# Patient Record
Sex: Female | Born: 1945 | Race: Black or African American | Hispanic: No | Marital: Single | Smoking: Current every day smoker
Health system: Southern US, Community
[De-identification: ages and names within clinical notes are randomized; demographics above are authoritative.]

## PROBLEM LIST (undated history)

## (undated) DIAGNOSIS — E119 Type 2 diabetes mellitus without complications: Secondary | ICD-10-CM

## (undated) DIAGNOSIS — Z9889 Other specified postprocedural states: Secondary | ICD-10-CM

## (undated) DIAGNOSIS — K08109 Complete loss of teeth, unspecified cause, unspecified class: Secondary | ICD-10-CM

## (undated) DIAGNOSIS — N189 Chronic kidney disease, unspecified: Secondary | ICD-10-CM

## (undated) DIAGNOSIS — Z972 Presence of dental prosthetic device (complete) (partial): Secondary | ICD-10-CM

## (undated) DIAGNOSIS — C801 Malignant (primary) neoplasm, unspecified: Secondary | ICD-10-CM

## (undated) DIAGNOSIS — G629 Polyneuropathy, unspecified: Secondary | ICD-10-CM

## (undated) DIAGNOSIS — Z973 Presence of spectacles and contact lenses: Secondary | ICD-10-CM

## (undated) DIAGNOSIS — I10 Essential (primary) hypertension: Secondary | ICD-10-CM

## (undated) DIAGNOSIS — E785 Hyperlipidemia, unspecified: Secondary | ICD-10-CM

## (undated) DIAGNOSIS — Z8679 Personal history of other diseases of the circulatory system: Secondary | ICD-10-CM

## (undated) DIAGNOSIS — I639 Cerebral infarction, unspecified: Secondary | ICD-10-CM

## (undated) DIAGNOSIS — J45909 Unspecified asthma, uncomplicated: Secondary | ICD-10-CM

## (undated) HISTORY — DX: Unspecified asthma, uncomplicated: J45.909

## (undated) HISTORY — PX: MASTECTOMY: SHX3

## (undated) HISTORY — DX: Hyperlipidemia, unspecified: E78.5

## (undated) HISTORY — DX: Essential (primary) hypertension: I10

## (undated) HISTORY — DX: Type 2 diabetes mellitus without complications: E11.9

## (undated) HISTORY — DX: Cerebral infarction, unspecified: I63.9

---

## 1966-10-08 HISTORY — PX: GANGLION CYST EXCISION: SHX1691

## 1997-10-08 HISTORY — PX: CEREBRAL ANEURYSM REPAIR: SHX164

## 1998-05-04 ENCOUNTER — Emergency Department (HOSPITAL_COMMUNITY): Admission: EM | Admit: 1998-05-04 | Discharge: 1998-05-04 | Payer: Self-pay | Admitting: Emergency Medicine

## 1999-04-04 ENCOUNTER — Emergency Department (HOSPITAL_COMMUNITY): Admission: EM | Admit: 1999-04-04 | Discharge: 1999-04-04 | Payer: Self-pay | Admitting: Emergency Medicine

## 1999-04-10 ENCOUNTER — Ambulatory Visit (HOSPITAL_COMMUNITY): Admission: RE | Admit: 1999-04-10 | Discharge: 1999-04-10 | Payer: Self-pay | Admitting: Family Medicine

## 2000-02-06 ENCOUNTER — Ambulatory Visit (HOSPITAL_COMMUNITY): Admission: RE | Admit: 2000-02-06 | Discharge: 2000-02-06 | Payer: Self-pay | Admitting: *Deleted

## 2000-04-25 ENCOUNTER — Ambulatory Visit (HOSPITAL_COMMUNITY): Admission: RE | Admit: 2000-04-25 | Discharge: 2000-04-25 | Payer: Self-pay | Admitting: *Deleted

## 2001-01-30 ENCOUNTER — Emergency Department (HOSPITAL_COMMUNITY): Admission: EM | Admit: 2001-01-30 | Discharge: 2001-01-30 | Payer: Self-pay | Admitting: Emergency Medicine

## 2001-05-12 ENCOUNTER — Emergency Department (HOSPITAL_COMMUNITY): Admission: EM | Admit: 2001-05-12 | Discharge: 2001-05-12 | Payer: Self-pay | Admitting: Emergency Medicine

## 2001-05-12 ENCOUNTER — Encounter: Payer: Self-pay | Admitting: Emergency Medicine

## 2001-06-21 ENCOUNTER — Emergency Department (HOSPITAL_COMMUNITY): Admission: EM | Admit: 2001-06-21 | Discharge: 2001-06-21 | Payer: Self-pay

## 2002-09-21 ENCOUNTER — Encounter: Payer: Self-pay | Admitting: Neurology

## 2002-09-21 ENCOUNTER — Inpatient Hospital Stay (HOSPITAL_COMMUNITY): Admission: EM | Admit: 2002-09-21 | Discharge: 2002-09-23 | Payer: Self-pay | Admitting: Emergency Medicine

## 2002-09-22 ENCOUNTER — Encounter (INDEPENDENT_AMBULATORY_CARE_PROVIDER_SITE_OTHER): Payer: Self-pay | Admitting: Cardiology

## 2002-09-23 ENCOUNTER — Inpatient Hospital Stay (HOSPITAL_COMMUNITY)
Admission: RE | Admit: 2002-09-23 | Discharge: 2002-10-05 | Payer: Self-pay | Admitting: Physical Medicine & Rehabilitation

## 2002-12-11 ENCOUNTER — Ambulatory Visit (HOSPITAL_COMMUNITY): Admission: RE | Admit: 2002-12-11 | Discharge: 2002-12-11 | Payer: Self-pay | Admitting: Neurology

## 2002-12-11 ENCOUNTER — Encounter: Payer: Self-pay | Admitting: Neurology

## 2003-01-05 ENCOUNTER — Inpatient Hospital Stay (HOSPITAL_COMMUNITY): Admission: RE | Admit: 2003-01-05 | Discharge: 2003-01-06 | Payer: Self-pay | Admitting: Interventional Radiology

## 2003-01-05 DIAGNOSIS — I671 Cerebral aneurysm, nonruptured: Secondary | ICD-10-CM | POA: Insufficient documentation

## 2003-02-04 ENCOUNTER — Encounter
Admission: RE | Admit: 2003-02-04 | Discharge: 2003-05-05 | Payer: Self-pay | Admitting: Physical Medicine & Rehabilitation

## 2003-04-29 ENCOUNTER — Ambulatory Visit (HOSPITAL_COMMUNITY): Admission: RE | Admit: 2003-04-29 | Discharge: 2003-04-29 | Payer: Self-pay | Admitting: Internal Medicine

## 2003-04-29 ENCOUNTER — Encounter: Payer: Self-pay | Admitting: Internal Medicine

## 2003-05-12 ENCOUNTER — Ambulatory Visit (HOSPITAL_COMMUNITY): Admission: RE | Admit: 2003-05-12 | Discharge: 2003-05-12 | Payer: Self-pay | Admitting: Interventional Radiology

## 2003-07-07 ENCOUNTER — Ambulatory Visit (HOSPITAL_COMMUNITY): Admission: RE | Admit: 2003-07-07 | Discharge: 2003-07-08 | Payer: Self-pay | Admitting: Interventional Radiology

## 2003-10-06 ENCOUNTER — Encounter
Admission: RE | Admit: 2003-10-06 | Discharge: 2004-01-04 | Payer: Self-pay | Admitting: Physical Medicine & Rehabilitation

## 2003-10-11 ENCOUNTER — Ambulatory Visit (HOSPITAL_COMMUNITY): Admission: RE | Admit: 2003-10-11 | Discharge: 2003-10-11 | Payer: Self-pay | Admitting: Interventional Radiology

## 2003-11-18 ENCOUNTER — Ambulatory Visit (HOSPITAL_COMMUNITY)
Admission: RE | Admit: 2003-11-18 | Discharge: 2003-11-18 | Payer: Self-pay | Admitting: Physical Medicine & Rehabilitation

## 2004-04-14 ENCOUNTER — Ambulatory Visit (HOSPITAL_COMMUNITY): Admission: RE | Admit: 2004-04-14 | Discharge: 2004-04-14 | Payer: Self-pay | Admitting: Interventional Radiology

## 2004-04-28 ENCOUNTER — Encounter: Admission: RE | Admit: 2004-04-28 | Discharge: 2004-04-28 | Payer: Self-pay | Admitting: Family Medicine

## 2004-05-31 ENCOUNTER — Encounter
Admission: RE | Admit: 2004-05-31 | Discharge: 2004-07-13 | Payer: Self-pay | Admitting: Physical Medicine & Rehabilitation

## 2004-06-16 ENCOUNTER — Ambulatory Visit (HOSPITAL_COMMUNITY)
Admission: RE | Admit: 2004-06-16 | Discharge: 2004-06-16 | Payer: Self-pay | Admitting: Physical Medicine & Rehabilitation

## 2004-07-13 ENCOUNTER — Encounter
Admission: RE | Admit: 2004-07-13 | Discharge: 2004-10-11 | Payer: Self-pay | Admitting: Physical Medicine & Rehabilitation

## 2004-07-14 ENCOUNTER — Ambulatory Visit: Payer: Self-pay | Admitting: Physical Medicine & Rehabilitation

## 2004-07-17 ENCOUNTER — Ambulatory Visit: Payer: Self-pay | Admitting: Family Medicine

## 2004-08-29 ENCOUNTER — Ambulatory Visit: Payer: Self-pay | Admitting: Family Medicine

## 2004-09-28 IMAGING — CT CT HEAD W/O CM
1 series · 16 of 30 positions shown, 20 images · non-contrast
Comparison: none

FINDINGS
CLINICAL DATA: APHASIA AND BILATERAL LOWER EXTREMITY WEAKNESS.
CT OF THE HEAD WITHOUT CONTRAST
NO PRIOR STUDIES FOR COMPARISON.
MILD ATROPHY PRESENT.  NO EVIDENCE OF VISIBLE HEMORRHAGE, INFARCT, MASS EFFECT OR EDEMA.  NO
HYDROCEPHALUS OR EXTRA-AXIAL FLUID COLLECTIONS.  BONE WINDOWS SHOW MUCOSAL THICKENING IN THE RIGHT
MAXILLARY ANTRUM.
IMPRESSION
NO DEFINITE ACUTE FINDINGS.

[Series 2: — · axial · 0.49mm/px · z∈[+145,+280]mm · 16 of 33 slices shown, 20 images]
[im 2/33  brain]
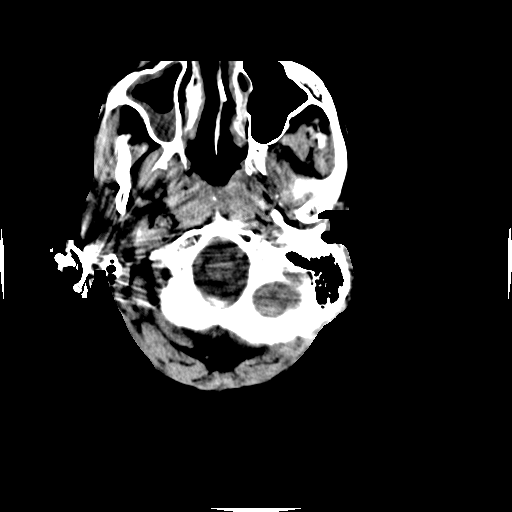
[im 2/33  bone]
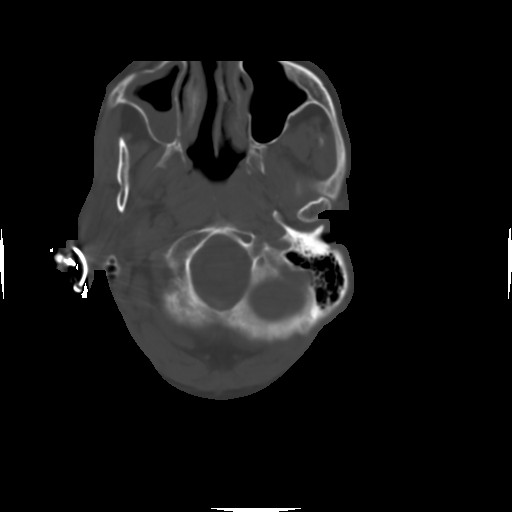
[im 4/33  brain]
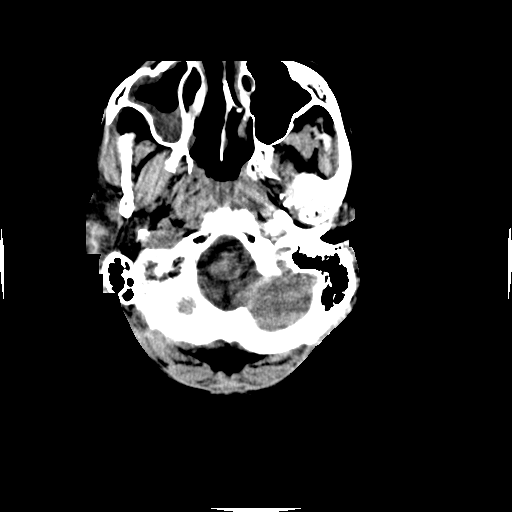
[im 6/33  brain]
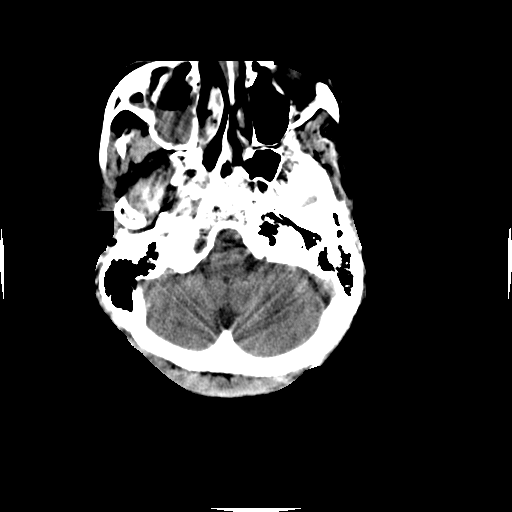
[im 8/33  brain]
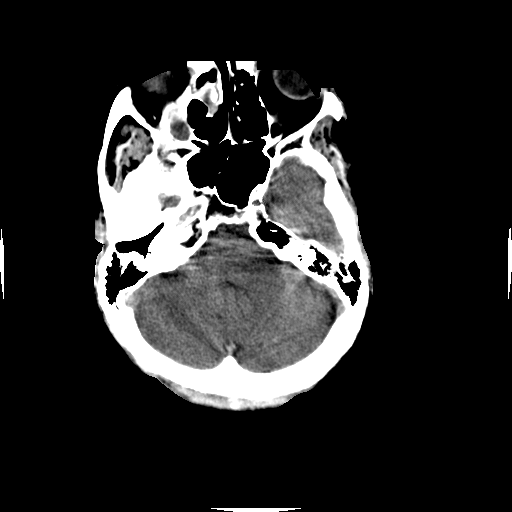
[im 9/33  brain]
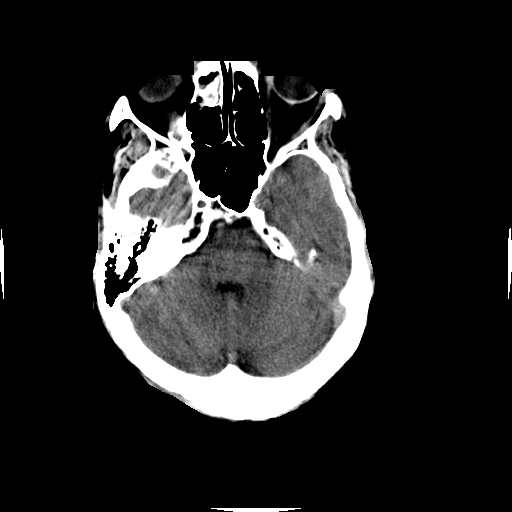
[im 9/33  bone]
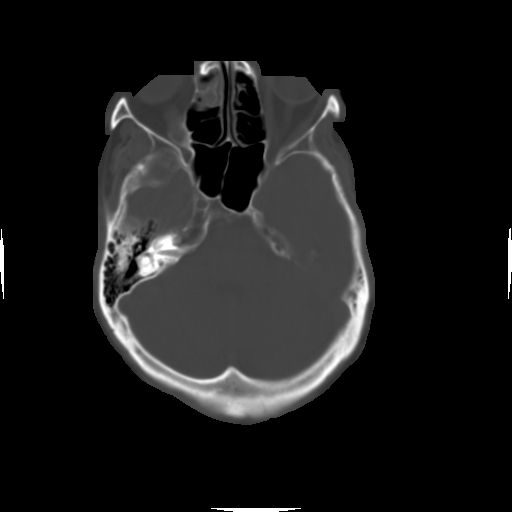
[im 12/33  brain]
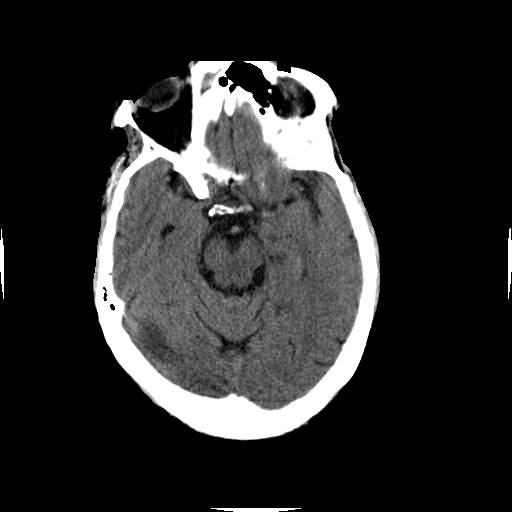
[im 14/33  brain]
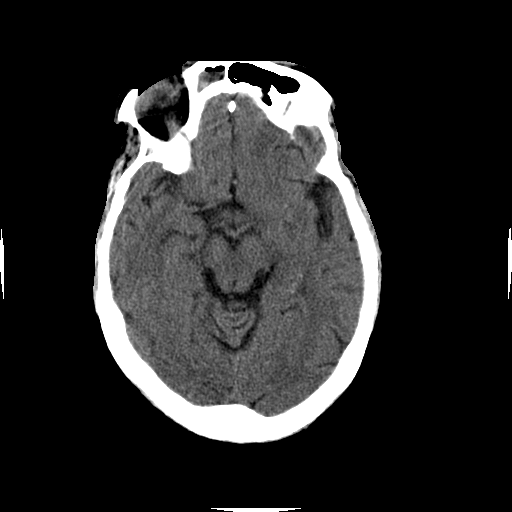
[im 16/33  brain]
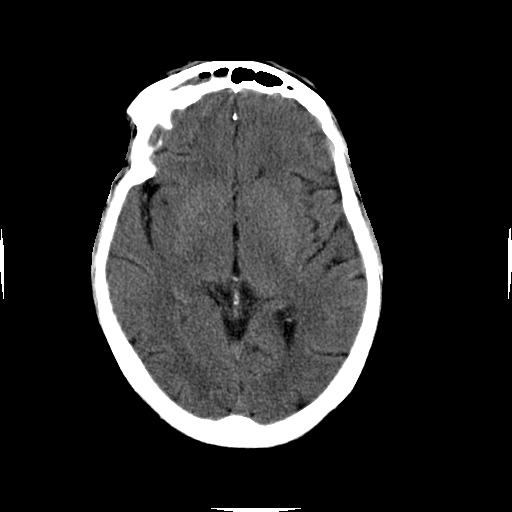
[im 17/33  brain]
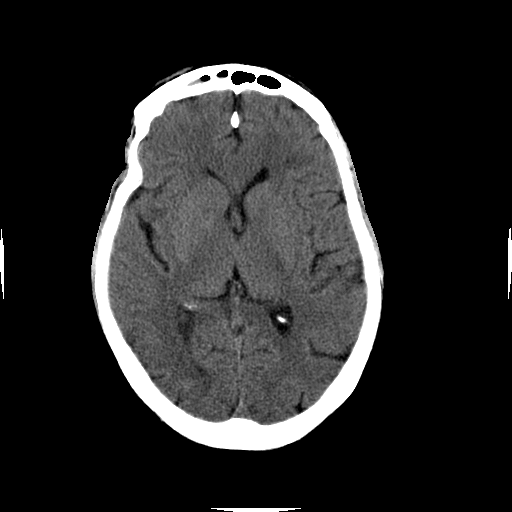
[im 17/33  bone]
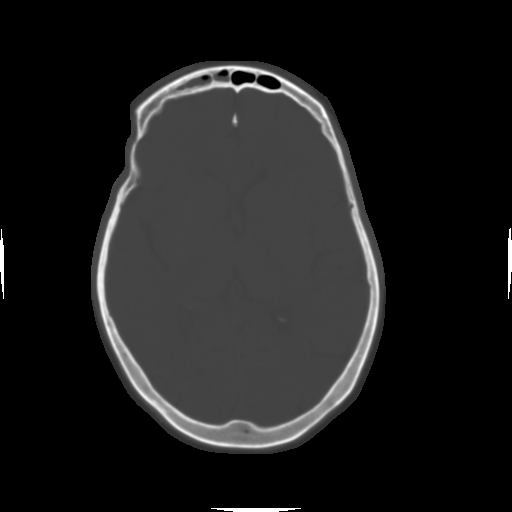
[im 19/33  brain]
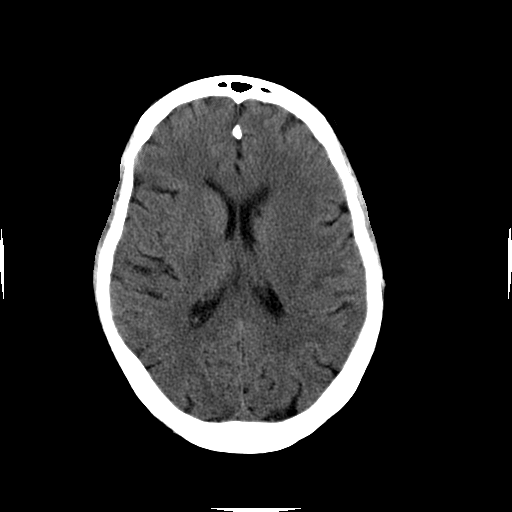
[im 21/33  brain]
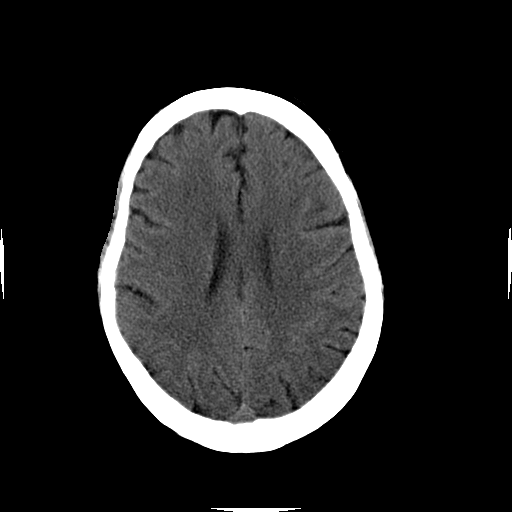
[im 24/33  brain]
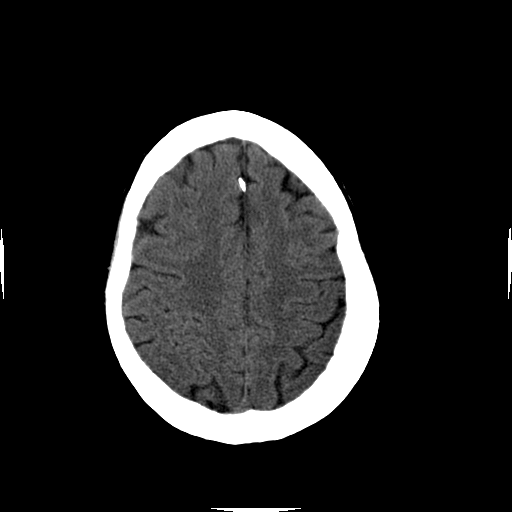
[im 25/33  brain]
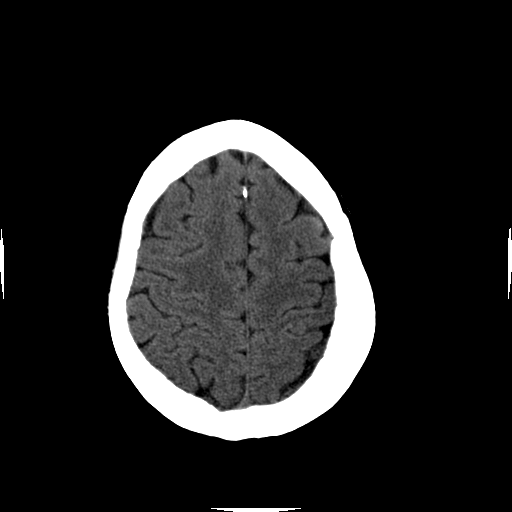
[im 25/33  bone]
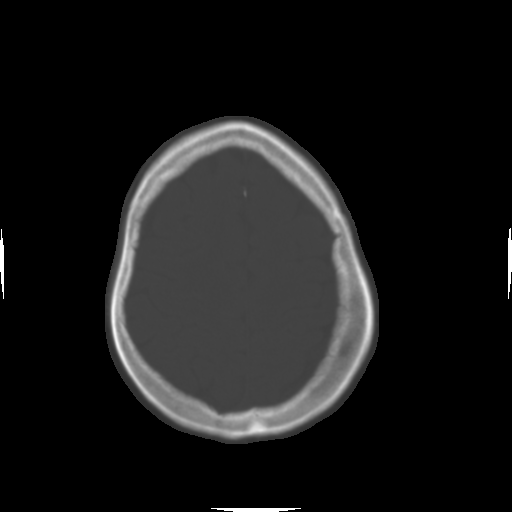
[im 27/33  brain]
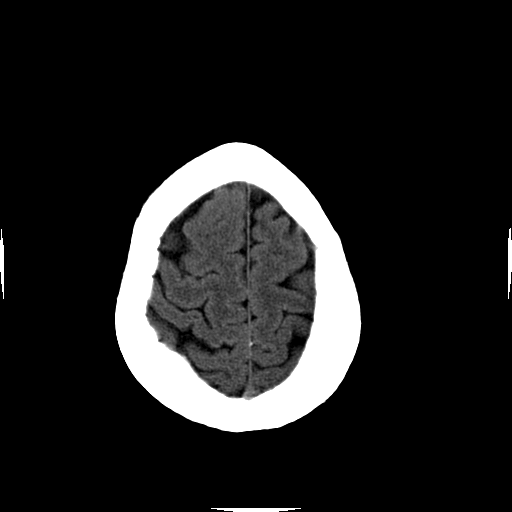
[im 29/33  brain]
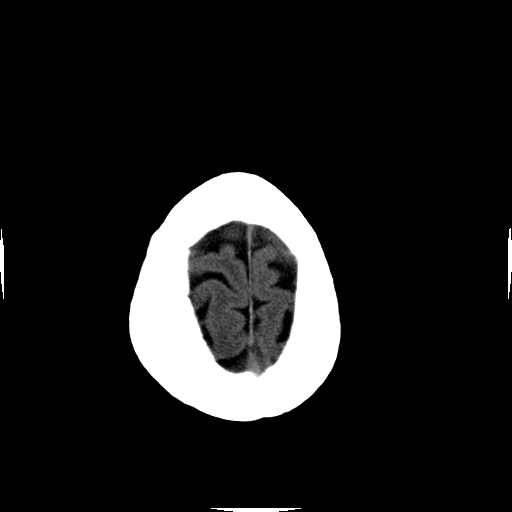
[im 31/33  brain]
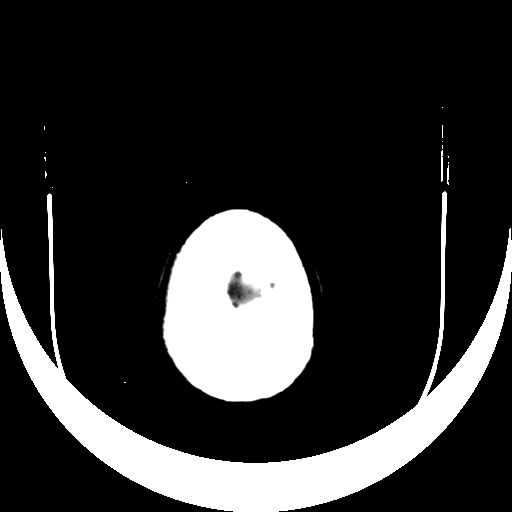

[16 of 30 positions shown; findings below may reference images not displayed]

## 2004-10-19 ENCOUNTER — Ambulatory Visit: Payer: Self-pay | Admitting: Family Medicine

## 2004-11-06 ENCOUNTER — Ambulatory Visit (HOSPITAL_COMMUNITY): Admission: RE | Admit: 2004-11-06 | Discharge: 2004-11-06 | Payer: Self-pay | Admitting: Interventional Radiology

## 2004-12-21 ENCOUNTER — Ambulatory Visit: Payer: Self-pay | Admitting: Nurse Practitioner

## 2005-01-04 ENCOUNTER — Encounter
Admission: RE | Admit: 2005-01-04 | Discharge: 2005-04-04 | Payer: Self-pay | Admitting: Physical Medicine & Rehabilitation

## 2005-03-12 ENCOUNTER — Ambulatory Visit: Payer: Self-pay | Admitting: Family Medicine

## 2005-04-23 ENCOUNTER — Ambulatory Visit: Payer: Self-pay | Admitting: Internal Medicine

## 2005-05-02 ENCOUNTER — Encounter
Admission: RE | Admit: 2005-05-02 | Discharge: 2005-07-31 | Payer: Self-pay | Admitting: Physical Medicine & Rehabilitation

## 2005-07-10 ENCOUNTER — Ambulatory Visit: Payer: Self-pay | Admitting: Physical Medicine & Rehabilitation

## 2005-08-01 ENCOUNTER — Ambulatory Visit: Payer: Self-pay | Admitting: Family Medicine

## 2005-12-17 ENCOUNTER — Ambulatory Visit: Payer: Self-pay | Admitting: Family Medicine

## 2006-01-14 ENCOUNTER — Ambulatory Visit: Payer: Self-pay | Admitting: Physical Medicine & Rehabilitation

## 2006-01-14 ENCOUNTER — Encounter
Admission: RE | Admit: 2006-01-14 | Discharge: 2006-04-14 | Payer: Self-pay | Admitting: Physical Medicine & Rehabilitation

## 2006-01-16 ENCOUNTER — Ambulatory Visit: Payer: Self-pay | Admitting: Family Medicine

## 2006-01-28 ENCOUNTER — Ambulatory Visit: Payer: Self-pay | Admitting: Family Medicine

## 2006-07-02 ENCOUNTER — Ambulatory Visit: Payer: Self-pay | Admitting: Family Medicine

## 2006-09-16 ENCOUNTER — Ambulatory Visit: Payer: Self-pay | Admitting: Family Medicine

## 2006-10-14 ENCOUNTER — Ambulatory Visit: Payer: Self-pay | Admitting: Family Medicine

## 2006-10-15 ENCOUNTER — Encounter
Admission: RE | Admit: 2006-10-15 | Discharge: 2007-01-13 | Payer: Self-pay | Admitting: Physical Medicine & Rehabilitation

## 2006-10-31 ENCOUNTER — Ambulatory Visit: Payer: Self-pay | Admitting: Family Medicine

## 2006-10-31 ENCOUNTER — Ambulatory Visit (HOSPITAL_COMMUNITY): Admission: RE | Admit: 2006-10-31 | Discharge: 2006-10-31 | Payer: Self-pay | Admitting: Family Medicine

## 2006-11-13 ENCOUNTER — Ambulatory Visit: Payer: Self-pay | Admitting: Family Medicine

## 2006-11-19 ENCOUNTER — Ambulatory Visit: Payer: Self-pay | Admitting: Physical Medicine & Rehabilitation

## 2007-02-24 ENCOUNTER — Ambulatory Visit: Payer: Self-pay | Admitting: Physical Medicine & Rehabilitation

## 2007-02-24 ENCOUNTER — Encounter
Admission: RE | Admit: 2007-02-24 | Discharge: 2007-05-25 | Payer: Self-pay | Admitting: Physical Medicine & Rehabilitation

## 2007-02-27 ENCOUNTER — Ambulatory Visit: Payer: Self-pay | Admitting: Internal Medicine

## 2007-03-24 DIAGNOSIS — I10 Essential (primary) hypertension: Secondary | ICD-10-CM | POA: Insufficient documentation

## 2007-03-24 DIAGNOSIS — J4489 Other specified chronic obstructive pulmonary disease: Secondary | ICD-10-CM | POA: Insufficient documentation

## 2007-03-24 DIAGNOSIS — M6740 Ganglion, unspecified site: Secondary | ICD-10-CM | POA: Insufficient documentation

## 2007-03-24 DIAGNOSIS — J449 Chronic obstructive pulmonary disease, unspecified: Secondary | ICD-10-CM | POA: Insufficient documentation

## 2007-03-24 DIAGNOSIS — G609 Hereditary and idiopathic neuropathy, unspecified: Secondary | ICD-10-CM | POA: Insufficient documentation

## 2007-03-24 DIAGNOSIS — M109 Gout, unspecified: Secondary | ICD-10-CM | POA: Insufficient documentation

## 2007-03-24 DIAGNOSIS — F1010 Alcohol abuse, uncomplicated: Secondary | ICD-10-CM | POA: Insufficient documentation

## 2007-03-24 DIAGNOSIS — Z9079 Acquired absence of other genital organ(s): Secondary | ICD-10-CM | POA: Insufficient documentation

## 2007-03-24 DIAGNOSIS — I6990 Unspecified sequelae of unspecified cerebrovascular disease: Secondary | ICD-10-CM | POA: Insufficient documentation

## 2007-08-28 ENCOUNTER — Ambulatory Visit: Payer: Self-pay | Admitting: Physical Medicine & Rehabilitation

## 2007-08-28 ENCOUNTER — Encounter
Admission: RE | Admit: 2007-08-28 | Discharge: 2007-08-30 | Payer: Self-pay | Admitting: Physical Medicine & Rehabilitation

## 2007-09-15 ENCOUNTER — Ambulatory Visit: Payer: Self-pay | Admitting: Internal Medicine

## 2007-09-15 ENCOUNTER — Encounter (INDEPENDENT_AMBULATORY_CARE_PROVIDER_SITE_OTHER): Payer: Self-pay | Admitting: Family Medicine

## 2007-09-15 LAB — CONVERTED CEMR LAB
ALT: 9 units/L (ref 0–35)
AST: 10 units/L (ref 0–37)
Albumin: 4.4 g/dL (ref 3.5–5.2)
Alkaline Phosphatase: 121 units/L — ABNORMAL HIGH (ref 39–117)
BUN: 18 mg/dL (ref 6–23)
Basophils Absolute: 0.1 10*3/uL (ref 0.0–0.1)
Basophils Relative: 1 % (ref 0–1)
CO2: 24 meq/L (ref 19–32)
Calcium: 9.2 mg/dL (ref 8.4–10.5)
Chloride: 108 meq/L (ref 96–112)
Cholesterol: 195 mg/dL (ref 0–200)
Creatinine, Ser: 1.06 mg/dL (ref 0.40–1.20)
Eosinophils Absolute: 0.2 10*3/uL (ref 0.2–0.7)
Eosinophils Relative: 2 % (ref 0–5)
Glucose, Bld: 78 mg/dL (ref 70–99)
HCT: 42.7 % (ref 36.0–46.0)
HDL: 41 mg/dL (ref >39)
Hemoglobin: 13.8 g/dL (ref 12.0–15.0)
LDL Cholesterol: 127 mg/dL — ABNORMAL HIGH (ref 0–99)
Lymphocytes Relative: 47 % — ABNORMAL HIGH (ref 12–46)
Lymphs Abs: 4 10*3/uL (ref 0.7–4.0)
MCHC: 32.3 g/dL (ref 30.0–36.0)
MCV: 95.7 fL (ref 78.0–100.0)
Monocytes Absolute: 0.4 10*3/uL (ref 0.1–1.0)
Monocytes Relative: 5 % (ref 3–12)
Neutro Abs: 3.9 10*3/uL (ref 1.7–7.7)
Neutrophils Relative %: 46 % (ref 43–77)
Platelets: 287 10*3/uL (ref 150–400)
Potassium: 5 meq/L (ref 3.5–5.3)
RBC: 4.46 M/uL (ref 3.87–5.11)
RDW: 14 % (ref 11.5–15.5)
Sodium: 144 meq/L (ref 135–145)
Total Bilirubin: 0.4 mg/dL (ref 0.3–1.2)
Total CHOL/HDL Ratio: 4.8
Total Protein: 7.5 g/dL (ref 6.0–8.3)
Triglycerides: 136 mg/dL (ref <150)
VLDL: 27 mg/dL (ref 0–40)
WBC: 8.5 10*3/uL (ref 4.0–10.5)

## 2007-10-14 ENCOUNTER — Ambulatory Visit: Payer: Self-pay | Admitting: Internal Medicine

## 2008-02-16 ENCOUNTER — Ambulatory Visit: Payer: Self-pay | Admitting: Internal Medicine

## 2008-02-16 ENCOUNTER — Encounter (INDEPENDENT_AMBULATORY_CARE_PROVIDER_SITE_OTHER): Payer: Self-pay | Admitting: Family Medicine

## 2008-02-16 LAB — CONVERTED CEMR LAB
ALT: 9 units/L (ref 0–35)
AST: 12 units/L (ref 0–37)
Albumin: 4.5 g/dL (ref 3.5–5.2)
Alkaline Phosphatase: 113 units/L (ref 39–117)
BUN: 16 mg/dL (ref 6–23)
CO2: 24 meq/L (ref 19–32)
Calcium: 9.1 mg/dL (ref 8.4–10.5)
Chloride: 108 meq/L (ref 96–112)
Cholesterol: 222 mg/dL — ABNORMAL HIGH (ref 0–200)
Creatinine, Ser: 1.22 mg/dL — ABNORMAL HIGH (ref 0.40–1.20)
Glucose, Bld: 110 mg/dL — ABNORMAL HIGH (ref 70–99)
HDL: 42 mg/dL (ref >39)
LDL Cholesterol: 152 mg/dL — ABNORMAL HIGH (ref 0–99)
Potassium: 4.4 meq/L (ref 3.5–5.3)
Sodium: 145 meq/L (ref 135–145)
Total Bilirubin: 0.5 mg/dL (ref 0.3–1.2)
Total CHOL/HDL Ratio: 5.3
Total Protein: 7.7 g/dL (ref 6.0–8.3)
Triglycerides: 138 mg/dL (ref <150)
VLDL: 28 mg/dL (ref 0–40)

## 2008-02-19 ENCOUNTER — Encounter
Admission: RE | Admit: 2008-02-19 | Discharge: 2008-02-23 | Payer: Self-pay | Admitting: Physical Medicine & Rehabilitation

## 2008-02-23 ENCOUNTER — Ambulatory Visit: Payer: Self-pay | Admitting: Physical Medicine & Rehabilitation

## 2008-06-11 ENCOUNTER — Encounter
Admission: RE | Admit: 2008-06-11 | Discharge: 2008-06-15 | Payer: Self-pay | Admitting: Physical Medicine & Rehabilitation

## 2008-06-15 ENCOUNTER — Ambulatory Visit: Payer: Self-pay | Admitting: Physical Medicine & Rehabilitation

## 2008-06-16 ENCOUNTER — Ambulatory Visit: Payer: Self-pay | Admitting: Family Medicine

## 2008-07-06 ENCOUNTER — Encounter: Payer: Self-pay | Admitting: Family Medicine

## 2008-07-06 ENCOUNTER — Ambulatory Visit: Payer: Self-pay | Admitting: Internal Medicine

## 2008-07-06 LAB — CONVERTED CEMR LAB
ALT: 9 units/L (ref 0–35)
AST: 11 units/L (ref 0–37)
Albumin: 4.3 g/dL (ref 3.5–5.2)
Alkaline Phosphatase: 116 units/L (ref 39–117)
BUN: 40 mg/dL — ABNORMAL HIGH (ref 6–23)
CO2: 21 meq/L (ref 19–32)
Calcium: 9 mg/dL (ref 8.4–10.5)
Chloride: 105 meq/L (ref 96–112)
Cholesterol: 141 mg/dL (ref 0–200)
Creatinine, Ser: 2.99 mg/dL — ABNORMAL HIGH (ref 0.40–1.20)
Glucose, Bld: 106 mg/dL — ABNORMAL HIGH (ref 70–99)
HDL: 33 mg/dL — ABNORMAL LOW (ref >39)
LDL Cholesterol: 69 mg/dL (ref 0–99)
Potassium: 4.9 meq/L (ref 3.5–5.3)
Sodium: 139 meq/L (ref 135–145)
Total Bilirubin: 0.3 mg/dL (ref 0.3–1.2)
Total CHOL/HDL Ratio: 4.3
Total Protein: 7.5 g/dL (ref 6.0–8.3)
Triglycerides: 195 mg/dL — ABNORMAL HIGH (ref <150)
VLDL: 39 mg/dL (ref 0–40)

## 2008-12-03 ENCOUNTER — Encounter
Admission: RE | Admit: 2008-12-03 | Discharge: 2008-12-03 | Payer: Self-pay | Admitting: Physical Medicine & Rehabilitation

## 2008-12-22 ENCOUNTER — Ambulatory Visit: Payer: Self-pay | Admitting: Internal Medicine

## 2008-12-22 ENCOUNTER — Encounter (INDEPENDENT_AMBULATORY_CARE_PROVIDER_SITE_OTHER): Payer: Self-pay | Admitting: Adult Health

## 2008-12-22 LAB — CONVERTED CEMR LAB
ALT: 10 units/L (ref 0–35)
AST: 12 units/L (ref 0–37)
Albumin: 4.6 g/dL (ref 3.5–5.2)
Alkaline Phosphatase: 129 units/L — ABNORMAL HIGH (ref 39–117)
BUN: 35 mg/dL — ABNORMAL HIGH (ref 6–23)
Basophils Absolute: 0.1 10*3/uL (ref 0.0–0.1)
Basophils Relative: 1 % (ref 0–1)
CO2: 21 meq/L (ref 19–32)
Calcium: 9.7 mg/dL (ref 8.4–10.5)
Chloride: 106 meq/L (ref 96–112)
Eosinophils Absolute: 0.4 10*3/uL (ref 0.0–0.7)
Eosinophils Relative: 3 % (ref 0–5)
Glucose, Bld: 86 mg/dL (ref 70–99)
HCT: 37.1 % (ref 36.0–46.0)
HDL: 30 mg/dL — ABNORMAL LOW (ref >39)
Hemoglobin: 12.2 g/dL (ref 12.0–15.0)
LDL Cholesterol: 51 mg/dL (ref 0–99)
Lymphocytes Relative: 34 % (ref 12–46)
Lymphs Abs: 4.5 10*3/uL — ABNORMAL HIGH (ref 0.7–4.0)
MCHC: 32.9 g/dL (ref 30.0–36.0)
MCV: 94.4 fL (ref 78.0–100.0)
Monocytes Absolute: 1 10*3/uL (ref 0.1–1.0)
Monocytes Relative: 8 % (ref 3–12)
Neutrophils Relative %: 55 % (ref 43–77)
Platelets: 329 10*3/uL (ref 150–400)
Potassium: 4 meq/L (ref 3.5–5.3)
RBC: 3.93 M/uL (ref 3.87–5.11)
RDW: 14.4 % (ref 11.5–15.5)
Sodium: 141 meq/L (ref 135–145)
TSH: 0.358 microintl units/mL (ref 0.350–4.500)
Total Bilirubin: 0.3 mg/dL (ref 0.3–1.2)
Triglycerides: 180 mg/dL — ABNORMAL HIGH (ref <150)
Uric Acid, Serum: 8.9 mg/dL — ABNORMAL HIGH (ref 2.4–7.0)
VLDL: 36 mg/dL (ref 0–40)
WBC: 13.3 10*3/uL — ABNORMAL HIGH (ref 4.0–10.5)

## 2008-12-23 ENCOUNTER — Encounter (INDEPENDENT_AMBULATORY_CARE_PROVIDER_SITE_OTHER): Payer: Self-pay | Admitting: Family Medicine

## 2008-12-23 LAB — CONVERTED CEMR LAB: Microalb, Ur: 0.69 mg/dL (ref 0.00–1.89)

## 2009-01-12 ENCOUNTER — Ambulatory Visit: Payer: Self-pay | Admitting: Internal Medicine

## 2009-01-12 ENCOUNTER — Encounter (INDEPENDENT_AMBULATORY_CARE_PROVIDER_SITE_OTHER): Payer: Self-pay | Admitting: Adult Health

## 2009-01-12 LAB — CONVERTED CEMR LAB
ALT: 9 units/L (ref 0–35)
AST: 12 units/L (ref 0–37)
Albumin: 4.1 g/dL (ref 3.5–5.2)
Alkaline Phosphatase: 103 units/L (ref 39–117)
BUN: 21 mg/dL (ref 6–23)
Basophils Absolute: 0.1 10*3/uL (ref 0.0–0.1)
Basophils Relative: 1 % (ref 0–1)
CO2: 23 meq/L (ref 19–32)
Calcium: 8.9 mg/dL (ref 8.4–10.5)
Chloride: 106 meq/L (ref 96–112)
Creatinine, Ser: 1.73 mg/dL — ABNORMAL HIGH (ref 0.40–1.20)
Eosinophils Relative: 3 % (ref 0–5)
Glucose, Bld: 110 mg/dL — ABNORMAL HIGH (ref 70–99)
HCT: 37.6 % (ref 36.0–46.0)
Hemoglobin: 11.9 g/dL — ABNORMAL LOW (ref 12.0–15.0)
MCHC: 31.6 g/dL (ref 30.0–36.0)
Monocytes Absolute: 0.6 10*3/uL (ref 0.1–1.0)
Monocytes Relative: 7 % (ref 3–12)
Neutro Abs: 4.2 10*3/uL (ref 1.7–7.7)
Neutrophils Relative %: 49 % (ref 43–77)
Platelets: 291 10*3/uL (ref 150–400)
Potassium: 4.3 meq/L (ref 3.5–5.3)
RBC: 3.87 M/uL (ref 3.87–5.11)
Sodium: 142 meq/L (ref 135–145)
Total Bilirubin: 0.4 mg/dL (ref 0.3–1.2)
Total Protein: 7.2 g/dL (ref 6.0–8.3)
WBC: 8.5 10*3/uL (ref 4.0–10.5)

## 2009-01-26 ENCOUNTER — Ambulatory Visit: Payer: Self-pay | Admitting: Internal Medicine

## 2009-02-08 ENCOUNTER — Ambulatory Visit (HOSPITAL_COMMUNITY): Admission: RE | Admit: 2009-02-08 | Discharge: 2009-02-08 | Payer: Self-pay | Admitting: Internal Medicine

## 2009-05-09 ENCOUNTER — Ambulatory Visit: Payer: Self-pay | Admitting: Internal Medicine

## 2009-05-09 ENCOUNTER — Encounter: Payer: Self-pay | Admitting: Family Medicine

## 2009-05-09 LAB — CONVERTED CEMR LAB: Microalb, Ur: 0.98 mg/dL (ref 0.00–1.89)

## 2009-07-07 ENCOUNTER — Ambulatory Visit: Payer: Self-pay | Admitting: Internal Medicine

## 2009-08-19 ENCOUNTER — Encounter
Admission: RE | Admit: 2009-08-19 | Discharge: 2009-08-30 | Payer: Self-pay | Admitting: Physical Medicine & Rehabilitation

## 2009-08-22 ENCOUNTER — Ambulatory Visit: Payer: Self-pay | Admitting: Physical Medicine & Rehabilitation

## 2010-01-03 ENCOUNTER — Ambulatory Visit: Payer: Self-pay | Admitting: Internal Medicine

## 2010-04-11 ENCOUNTER — Ambulatory Visit: Payer: Self-pay | Admitting: Internal Medicine

## 2010-04-11 ENCOUNTER — Encounter (INDEPENDENT_AMBULATORY_CARE_PROVIDER_SITE_OTHER): Payer: Self-pay | Admitting: Adult Health

## 2010-04-11 LAB — CONVERTED CEMR LAB
AST: 13 units/L (ref 0–37)
Albumin: 4.3 g/dL (ref 3.5–5.2)
Alkaline Phosphatase: 94 units/L (ref 39–117)
BUN: 24 mg/dL — ABNORMAL HIGH (ref 6–23)
CO2: 23 meq/L (ref 19–32)
Calcium: 9.1 mg/dL (ref 8.4–10.5)
Chloride: 108 meq/L (ref 96–112)
Cholesterol: 92 mg/dL (ref 0–200)
Creatinine, Ser: 1.57 mg/dL — ABNORMAL HIGH (ref 0.40–1.20)
Glucose, Bld: 98 mg/dL (ref 70–99)
HDL: 29 mg/dL — ABNORMAL LOW (ref >39)
LDL Cholesterol: 37 mg/dL (ref 0–99)
Sodium: 144 meq/L (ref 135–145)
Total Bilirubin: 0.3 mg/dL (ref 0.3–1.2)
Total CHOL/HDL Ratio: 3.2
Total Protein: 7 g/dL (ref 6.0–8.3)
Triglycerides: 130 mg/dL (ref <150)
VLDL: 26 mg/dL (ref 0–40)

## 2010-09-20 ENCOUNTER — Ambulatory Visit (HOSPITAL_COMMUNITY)
Admission: RE | Admit: 2010-09-20 | Discharge: 2010-09-20 | Payer: Self-pay | Source: Home / Self Care | Attending: Internal Medicine | Admitting: Internal Medicine

## 2010-10-29 ENCOUNTER — Encounter: Payer: Self-pay | Admitting: Interventional Radiology

## 2011-02-20 NOTE — Assessment & Plan Note (Signed)
Pamela Hess is back regarding her neuropathic pain and stroke.  She still  uses Darvocet on a fairly routine basis for her leg pain.  Neurontin  helps in general for the daytime symptoms.  She rates her pain today as  0-6/10.  She is not working currently due to the fact that she was laid  off.  Her PCP is watching her blood pressure and her medication was  recently adjusted upward.   REVIEW OF SYSTEMS:  Notable for the above.  Full review is in the health  and history section of the chart.   SOCIAL HISTORY:  The patient is single and at home alone.   PHYSICAL EXAMINATION:  VITAL SIGNS:  Blood pressure 161/94, pulse 54,  respiratory rate 18, and she is saturating 96% on room air.  GENERAL:  The patient is pleasant, alert and oriented x3.  Affect is  bright and appropriate.  She has good gait and stride length/width.  Coordination is good.  Reflexes are 2+.  Sensation is still a bit  decreased on the right side.  HEART:  Regular.  CHEST:  Clear.  ABDOMEN:  Soft and nontender.   ASSESSMENT:  1. History of left cerebrovascular accident.  2. Neuropathic lower extremity pain.  3. Gout.  4. Left leg length discrepancy.   PLAN:  1. I will add [name] 10 mg nightly for neuropathic pain with the goal      of coming off of the Darvocet as she could not tolerate it with      Tylenol alone.  2. Continue Neurontin 300 mg t.i.d.  3. Follow up with Pamela Hess, M.D. for blood pressure maintenance.      Pamela Hess, M.D.  Electronically Signed     ZTS/MedQ  D:  02/23/2008 12:16:45  T:  02/23/2008 12:50:19  Job #:  [id number]   cc:   Pamela Fry Day, MD  Fax: [phone]

## 2011-02-20 NOTE — Assessment & Plan Note (Signed)
Pamela Hess is back regarding her neuropathic pain.  She has been doing  fairly well with this.  She uses Darvocet for breakthrough pain.  She  uses Neurontin at nighttime.  She feels that the Neurontin and Darvocet  work best when she takes them together.  She notes that her blood  pressure has been up and she has been seeing Dr. Margarette Canada regarding  this.  She continues to work 20 hours a week.  She stays active walking  regularly.  She is trying to eat better and generally live a better  healthy lifestyle.  Patient rates her pain at a 2-4/10, describes it as  burning and tingling.  Sleep is fair.  Pain interferes with general  activity, relationships with others and enjoyment of life on a mild  level.   REVIEW OF SYSTEMS:  Patient reports the above.  Other pertinent  positives are listed in the health and history section.   SOCIAL HISTORY:  Patient is single and working in maintenance still.   PHYSICAL EXAM:  Blood pressure is 174/111, repeat pressure was 172/108.  Patient seemed alert and appropriate.  She is a bit anxious, as usual,  but gait is stable.  She has good range of motion.  She had motor  function at 5/5 bilaterally.  She has decreased sensation in the distal  limbs which is unchanged.  Reflexes are 2+.  Cognitively she is intact.  Cranial nerve exam is unchanged.  HEART:  Regular rate.  CHEST:  Clear.  ABDOMEN:  Soft, nontender.   ASSESSMENT:  1. History of left cerebrovascular accident.  2. Neuropathic lower extremity pain.  3. History of gout.  4. History of left leg length discrepancy.   PLAN:  1. Advised patient to follow up with Dr. Margarette Canada regarding her blood      pressure ASAP.  2. Will taper off Darvocet for breakthrough pain and titrate Neurontin      upwards to 300 mg t.i.d.  Encourage Tylenol for breakthrough pain.      I gave her a schedule to perform the medication changes as noted      above.  3. I will see the patient back in about 6 months'  time.   Aside from her blood pressure, she is doing quite well.      Ranelle Oyster, M.D.  Electronically Signed     ZTS/MedQ  D:  02/25/2007 10:20:16  T:  02/25/2007 11:18:40  Job #:  [id number]   cc:   Fannie Knee Drinkard, Dr.

## 2011-02-20 NOTE — Assessment & Plan Note (Signed)
Pamela Hess is back regarding her stroke and neuropathic pain.  She is doing  very well.  She has tapered her Darvocet down dramatically.  She is  using 1 or 2 a week.  She still uses Neurontin for her neuropathic pain.  She uses colchicine and allopurinol for gout control.  She rates her  pain a 2/10.  She is working part-time.  She states that her PCP has  been managing her blood pressure and adjusting medications.  Denied any  other issues today.   REVIEW OF SYSTEMS:  Notable for the above.  A full review is in the  written Health & History Section.   SOCIAL HISTORY:  The patient is single.  She does report smoking 1 or 2  cigarettes a day again.   PHYSICAL EXAM:  Blood pressure is 152/101 with recheck 163/104, pulse is  54, respiratory rate 18, she is sating 95% on room air.  The patient is  pleasant, alert and oriented x3.  Affect is generally bright and  appropriate.  Gait is stable.  She walks with normal stride, length and  width.  Fine motor coordination is improved.  Reflexes are 2+.  Cognitively, she is appropriate.  HEART:  Regular.  CHEST:  Clear.  ABDOMEN:  Soft, nontender.   ASSESSMENT:  1. History of left cerebrovascular accident.  2. Neuropathic lower extremity pain which is improved.  3. History of gout.  4. History of left leg length discrepancy.   PLAN:  1. Advise smoking cessation.  2. Follow up with Dr. Margarette Canada regarding blood pressure once again.  3. Continue to wean Darvocet as able.  4. Continue Neurontin 300 mg t.i.d.  5. I will see her back in 6 months' time.  She is generally doing      quite well overall.      Pamela Hess, M.D.  Electronically Signed     ZTS/MedQ  D:  08/29/2007 11:20:52  T:  08/29/2007 17:45:12  Job #:  [id number]   cc:   Pamela Hess, Dr.

## 2011-02-20 NOTE — Assessment & Plan Note (Signed)
Pamela Hess is back regarding her stroke and neuropathic pain.  We placed her  on [name] in May and she did well with this initially.  Last week she  called with some increase in her night time pain and increased  requirement of Darvocet.  We increased her [name] to 25 mg and this has  worked nicely.  The pain is 3-4/10.  She used minimal Darvocet over the  last few days.  She is on Neurontin 300 mg t.i.d. as well.  She has been  working part-time, still doing Scientist, research (physical sciences) work.  She seems to be  fairly happy with it.  Dr. Morrie Sheldon placed her on some Lasix apparently for  her blood pressure and lower extremity edema.   REVIEW OF SYSTEMS:  Notable for the above.  A full 14-point review is in  the written health and history section.   SOCIAL HISTORY:  Unchanged and other issues noted above.   PHYSICAL EXAMINATION:  VITAL SIGNS:  Blood pressure is 191/85, pulse is  70, respiratory rate 18, and she is sating 98% on room air.  GENERAL:  The patient is pleasant, alert, and oriented x3.  She is a bit  anxious as her usual, but bright and appropriate, otherwise.  Gait is  stable with good length and width still.  Coordination is excellent.  Reflexes are 2+.  She continues to have some decreased sensation on the  right side to pinprick and light touch.  HEART:  Regular.  CHEST:  Clear.  ABDOMEN:  Soft and nontender.   ASSESSMENT:  1. History of left cerebrovascular accident.  2. Neuropathic lower extremity pain.  3. Gout.  4. Left leg length discrepancy.  5. Hypertension.   PLAN:  1. I asked her to follow up with her PCP who she sees tomorrow      regarding her blood pressure.  She needs to be more proactive on      her own as well.  2. Continue [name] 25 mg at bedtime.  She may increase to 50 mg at      bedtime if needed.  3. Continue Neurontin scheduled and Darvocet if needed.  4. I will see her back in 6 months.      Ranelle Oyster, M.D.  Electronically Signed      ZTS/MedQ  D:  06/15/2008 09:39:01  T:  06/15/2008 22:33:11  Job #:  [id number]   cc:   Karene Fry Day, MD  Fax: [phone]

## 2011-02-23 NOTE — Discharge Summary (Signed)
NAME:  EVI, MCCOMB                     ACCOUNT NO.:  [id number]   MEDICAL RECORD NO.:  [id number]                   PATIENT TYPE:  INP   LOCATION:  4735                                 FACILITY:  [facility]   PHYSICIAN:  Casimiro Needle L. Thad Ranger, M.D.           DATE OF BIRTH:  [birth year]   DATE OF ADMISSION:  09/21/2002  DATE OF DISCHARGE:  09/23/2002                                 DISCHARGE SUMMARY   DISCHARGE DIAGNOSES:  1. Left parietal infarct.  2. Hypertension.  3. Hyperhomocystinemia.  4. Urinary tract infection.  5. Gout.  6. Asthma.  7. Depression and anxiety.  8. Question right internal carotid artery aneurysm.  9. History of breast cancer.  10.      Uterine fibroids.  11.      Hammer toes.  12.      Bunions.  13.      Ganglion of her right wrist.  14.      Status post breast biopsy with negative regional lymph nodes.     Radiation treatment 13 years ago. The patient is in remission.  15.      Status post partial hysterectomy with right oophorectomy due to     fibroids.  16.      Surgery on her feet.  17.      Status post removal of ganglion cyst.   DISCHARGE MEDICATIONS:  1. Advair inhaler one puff b.i.d.  2. Advil 200 mg t.i.d.  3. Catapres 0.2 mg b.i.d.  4. Colchicine 0.6 mg b.i.d.  5. Elavil 25 mg q.h.s.  6. Lasix 20 mg q.d.  7. Lopressor 50 mg b.i.d.  8. Altace 2.5 mg q.d.  9. Foltx one p.o. q.d.  10.      Tequin 400 mg p.o. for three days, started on 12/16.  11.      Aspirin 325 mg p.o. q.d.   STUDIES PERFORMED:  1. CT of the head showed no acute stroke or hemorrhage. Mild atrophy     present. _________ nasal thickening in right maxillary antrum.  2. MRI of the brain showed acute nonhemorrhagic infarct posterior to the     left sylvian fissure extending into the left parietal lobe. Nonspecific     white matter changes present. Small lacune caudate in the left caudate     head. Opacification of right frontal sinus and maxillary sinus.  3. MRA of the  brain showing intracranial atherosclerotic type changes. 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  4. Two-D echocardiogram showing ejection fraction of 55 to 65%. There was     mild asymmetric septal hypertrophy. There was systolic mitral bowing of     the anterior leaflet but no mitral valve prolapse. There was no embolic     source.  5. Carotid Dopplers were normal.  6. Chest x-ray showed no active disease.  7. EKG showed sinus bradycardia. Question of T-wave  abnormality with minor     changes of his previous ECG.   HISTORY OF PRESENT ILLNESS:  Ms. Soden is a 65 year old right-handed  black female who has a history significant for hypertension who had sudden  onset of confusion and difficulty with her speech on Saturday afternoon.  This continued on Sunday without change. She refused to come to the hospital  despite her daughter voicing that she was concerned she was having a stroke.  On Monday morning, she tried to get out of the bed and fell when her legs  gave out. She felt that her right was weaker than her left. She agreed to  come to the hospital for further evaluation. She was not a TPA candidate  secondary to time.   HOSPITAL COURSE:  Upon admission, aphasia was very pronounced, using _______  and neologisms. She had dysfluency of her speech. Deficits improved  drastically over the next two days with only mild hesitancy of speech and  occasional mispronunciation of word on discharge. She was able to walk 150  feet independently but was slow or slightly staggering.   MRI did reveal acute infarct, most likely secondary to hypertension and  elevated homocystine. Lipid panel was pending at time of discharge. Suspect  this may also be abnormal.   Of interest, question of right ICA aneurysm was noted on the MRA. Would  recommend cerebral angiogram in the future to further evaluate this and  possible need for intervention. Other risks  factors identified include  elevated homocystine which was treated with Foltx which is vitamin B6 and 12  and folate combination. Hypertension was out of control. Initially, beta  blocker was increased, but her heart rate got too low. Beta blocker was  returned to normal dosage, and Altace was added. This will need followup  closely. The patient also diagnosed with UTI and placed on Tequin during her  admission.   CONDITION ON DISCHARGE:  The patient was oriented x3. Speech was clear. Her  aphasia was much improved with only mild hesitancy and occasional missed  sound when repeating words. She does have difficulty repeating complex  sounds. Her visual fields are full. Her extraocular movements are intact.  She has no facial weakness. Her tongue is midline. Her right upper extremity  does drift with 3-4/5 strength in her deltoid and 4+/5 in triceps and  shoulders. Right lower extremity has 4/5 proximally, 3/5 distally. Her  strength in her left side was okay. Cerebellar function is decreased in her  right upper extremity with poor finger-to-nose and _______ rapid repetitive  movements. Her gait was unsteady, and she leans to the right but is able to  walk independently. Her chest is clear to auscultation. Heart rate is  regular. She does have bilateral #1 metatarsal joint edema. No redness. No  tenderness.   DISCHARGE PLAN:  1. Aspirin for stroke prophylaxis.  2. Stroke secondary prevention, especially related to hypertension. May need     to increase Altace over time. Keep patient on Foltx for elevated     hyperhomocystinemia.  3. Patient transferred to rehab for continued PT, OT, and speech therapy.     Think this will be a short stay.  4.     Will need cerebral angio in future for aneurysm followup.  5. Needs to make an appointment with Demetrio Lapping, P.A., on a day that    Dr. Thad Ranger is there four to six weeks after discharge from rehab.     Annie Main, N.P.  Michael L. Thad Ranger, M.D.    SB/MEDQ  D:  09/23/2002  T:  09/24/2002  Job:  [id number]

## 2011-02-23 NOTE — Assessment & Plan Note (Signed)
Krisy is back regarding her stroke and neuropathic pain.  Her feet have  been doing fairly well.  She is complaining of more hip pain.  She had  x-rays of her hip done by Dr. 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  She has tried some muscle relaxant, without any obvious  benefit.  When questioned today the patient rated her pain was a 4/10  and more over the right pelvis and low back.  The pain interferes with  general activity, relations with others, and enjoyment of life on a mild  level.  She finds some relief with ibuprofen that she uses over the  counter.  She uses her Darvocet for more of her leg pain and this helps  the hip as well.   REVIEW OF SYSTEMS:  Pertinent positives are noted above.  Full review in  the health and history section otherwise normal.   SOCIAL HISTORY:  Without change.  She had been working part time up to  December 30 of last year.   PHYSICAL EXAMINATION:  Blood pressure 126/87, pulse was 65, respiratory  rate 16, she is satting 95% on room air.  The patient is pleasant, in no acute distress.  She is alert and  oriented times 3.  Affect is bright and appropriate.  Gait generally stable, though I looked at her back and pelvis today and  she had elevation of the right hemipelvis over the left.  We measured  leg length and she was essentially 94 cm on the left and 95.5 to 96 cm  on the right.  She had minimal pain with flexion, extension, rotation  and lateral bending today.  Straight leg testing and seated slump  testing were negative.  Motor function was 5/5.  Sensory remains  increased in the distal limbs but is stable.  Reflexes are 2+  bilaterally.  Cognitively, she is appropriate.  She remains a bit anxious.  Cranial  nerve exam is intact.  Heart is regular.  Chest is clear.  Abdomen was soft and nontender.   ASSESSMENT:  1. History of left cerebrovascular accident.  2. Neuropathic lower extremity pain.  3. History of gout.  4. Right low back pain, likely related to the leg length discrepancy.      The patient apparently does have arthritis on her hip x-ray,      although the pain does not really correspond to this problem.   PLAN:  1. Continue to use Cymbalta 60 mg daily.  2. Maintain Darvocet p.r.n. for breakthrough foot pain.  3. She will use colchicine and allopurinol for gout control.  4. Continue Neurontin at bedtime.  5. Encouraged patient to purchase a left heel insert of approximately      one-quarter inch to place in her shoe to balance out her pelvis.  I      also gave her low back and pelvic exercises to perform to work on      range of motion and stretching.  She should continue with her      exercise as she has been doing and her walking.  6. The patient may use over-the-counter ibuprofen 400-800 mg q. 8      hours p.r.n.Marland Kitchen  She also may benefit from      glucosamine over the counter.  7. We will see the patient back in about four months' time.      Ranelle Oyster, M.D.  Electronically Signed     ZTS/MedQ  D:  11/19/2006 11:13:25  T:  11/19/2006 12:15:35  Job #:  [id number]

## 2011-02-23 NOTE — Assessment & Plan Note (Signed)
HISTORY OF PRESENT ILLNESS:  Pamela Hess is back regarding her  neuropathic pain. She is doing quite well with her current pain regimen,  which includes Cymbalta 60 mg q. day and Darvocet 1 q. 8 hours p.r.n.  The  pain is at a 4 out of 10 in general. She is still working part-time doing  maintenance work and seems to be very happy with that. She has had some  problems with her toenails on the first toes of each foot. The toenail seems  to persistently fall off. She has had good reports from her Interventional  Radiologist and no further followup is needed at this time.   SOCIAL HISTORY:  The patient denies any new social issues. She has been  active with her son and family.   REVIEW OF SYSTEMS:  The patient reports some weakness, numbness, and  tingling in the extremities. Denies any depression or psychiatric issues.  Denies any constitutional, GU, GI, or cardiorespiratory problems today.   PHYSICAL EXAMINATION:  VITAL SIGNS:  Blood pressure 137/80, pulse 53,  respiratory rate 16. She is sating 96% on room air.  GENERAL:  The patient is pleasant. No acute distress.  NEUROLOGIC:  She is alert and oriented x3. She gets a bit anxious but  generally is appropriate and can be re-directed. Coordination is stable.  Reflexes are 1+. Sensation is decreased in the distal extremities. Cranial  nerve examination is intact.  HEART:  Regular rate and rhythm.  LUNGS:  Clear.  ABDOMEN:  Soft, nontender.  SKIN:  Stable.  EXTREMITIES:  She has lost both of her nails on the great toes. There may be  some slight onychomycosis in the second toes of each foot. The toenail bed  does not appear infected at this point. Area is appropriately tender.   ASSESSMENT:  1.  History of left cerebrovascular accident.  2.  Bilateral foot pain consistent with neuropathic pain.  3.  Questionable gout.   PLAN:  1.  Continue Cymbalta 60 mg q. day.  2.  Will maintain Darvocet as needed for breakthrough pain.  3.   She will stay on her Colchicine and Allopurinol.  4.  Neurontin 300 mg q.h.s.  5.  Recommend podiatry consultation for toenail loss. She may have a fungal      infection leading towards this.   FOLLOW UP:  I will see the patient back in 6 months time.      Ranelle Oyster, M.D.  Electronically Signed     ZTS/MedQ  D:  07/11/2005 11:51:46  T:  07/11/2005 13:59:53  Job #:  [id number]

## 2011-02-23 NOTE — Assessment & Plan Note (Signed)
MEDICAL RECORD NUMBER:  [id number]   DATE:  January 08, 2005   Pamela Hess is back regarding her neuropathic foot pain.  She has done  well with the initiation of the Cymbalta at 60 mg daily.  She says that her  pain is fairly reasonable.  She has some discomfort but more numbness than  anything at this time.  She would like to come off medication at this time.  Pain is at a 2 to 3/10 level.  She has had good reports from her neurologist  and interventional radiologist in hopes that she will need no further  radiological intervention regarding her aneurysm.  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  This keeps her happy.   REVIEW OF SYSTEMS:  Unchanged.  The pertinent positives are listed above.   SOCIAL HISTORY:  The patient is still single and working as above. She is  not using alcohol or tobacco.   PHYSICAL EXAMINATION:  VITAL SIGNS:  Today, blood pressure is 169/85, pulse  61, respiratory rate 16, saturating 90% on room air.  GENERAL:  The patient walks favoring the left side a bit but has good  control.  I had her hop on either foot, and she had most difficulty hopping  on the right foot today, losing balance quickly.  Motor function is a bit  diminished on the right side at 4/5 while she is 5/5 on the left.  Cranial  nerves II-XII grossly intact.  Sensory exam is decreased on the right  particularly but also on the left foot.  SKIN:  Intact.  Edema is noted.  HEART:  Regular rate and rhythm.  LUNGS:  Clear.   ASSESSMENT:  1.  History of left cerebrovascular accident.  2.  Bilateral foot pain consistent with neuropathic discomfort.  3.  Questionable gout also.   PLAN:  1.  Continue with Neurontin but taper to off over the next four weeks' time.  2.  Keep Cymbalta at 60 mg daily.  3.  She may use Darvocet for breakthrough pain.  4.  I will follow up with the patient in about three months' time.      ZTS/MedQ  D:  01/08/2005  16:01:35  T:  01/08/2005 16:54:25  Job #:  [id number]

## 2011-02-23 NOTE — Discharge Summary (Signed)
NAME:  Pamela Hess, Pamela Hess                     ACCOUNT NO.:  [id number]   MEDICAL RECORD NO.:  [id number]                   PATIENT TYPE:  IPS   LOCATION:  4029                                 FACILITY:  [facility]   PHYSICIAN:  Junie Bame, P.A.                 DATE OF BIRTH:  1946-01-16   DATE OF ADMISSION:  09/23/2002  DATE OF DISCHARGE:  10/05/2002                                 DISCHARGE SUMMARY   DISCHARGE DIAGNOSES:  1. Left parietal cerebrovascular accident.  2. History of gout.  3. Tinea versicolor.  4. History of hypertension.  5. History of asthma.  6. Cerebral aneurysm.   HISTORY OF PRESENT ILLNESS:  The patient is a 65 year old right-handed black  female admitted on 09/21/02 for evaluation of speech difficulties and  weakness; carotid Dopplers revealed no significant ACA stenosis, MRI  revealed left parietal/subcortical infarct with satellite lesions.  Echocardiogram is pending at this time.  PT reports the patient is  ambulating with minimal assist approximately 50 feet with rolling walker,  transfers moderate assist.  The patient is followed by Dr. Faith Rogue  and Dr. Kelli Hope.  Diagnosis of a left inferior infarction, left  parietal infarct.  Hospital course significant for a UTI, elevated blood  pressure, an aneurysm, ACA of the cavernous segment.  The patient to follow  up with Demetrio Lapping at the time of discharge.  The patient was  transferred to [facility] on 09/21/02.   PAST MEDICAL HISTORY:  1. Breast cancer.  2. Gout.  3. Asthma.  4. Depression.   PAST SURGICAL HISTORY:  Partial hysterectomy.   PRIMARY CARE PHYSICIAN:  Dr. Fannie Knee Dinkard.   SOCIAL HISTORY:  The patient lives with daughter and grandchildren in a one  level home in [place], with four step to entry.  Daughter works part  time.  The patient worked part time in the school systems.  She has quit  tobacco.  No alcohol.  Daughter able to assist some.   ALLERGIES:  None.   FAMILY HISTORY:  Mother deceased from breast cancer.  Father deceased from  aneurysm.   HOSPITAL COURSE:  The patient was admitted to [facility] on  09/23/02, for comprehensive patient rehabilitation and was seen more than  three hours of therapy daily.  Her hospital course significant for the  following:   Problem 1.  Left parietal infarct.  Overall the patient had great progress  during her rehab stay.  She was able to tolerate therapies very well.  She  had no significant new neurological symptoms occurred while in rehab.  She  remained on aspirin 325 mg p.o. q.d. for CVA prophylaxis.  Prior to  discharge the patient stayed at the [facility] apartment; on 10/04/08 she did very  well.  At the time of discharge the patient was ambulating approximately  greater than 100 feet, modified independently with the use of a _________.  She was bed mobility modified independently.  The patient remained on a  regular diet, thin liquids.  She received speech therapies throughout her  stay in rehab.   Problem 2.  History of hypertension.  The patient's blood pressure remained  in fair control while in rehab.  Adjustment had to be made in her blood  pressure medication, Clonidine, with decrease from 0.2 to 0.1 due to  headache.  She remained on Altace through her entire stay in rehab.  Altace  was increased from 5 mg to 10 mg b.i.d.  She remained on Lasix on Lopressor  throughout her stay in rehab.   Problem 3.  History of gouty arthritis.  She remained on colchicine 0.1 mg  b.i.d.   Problem 4.  Cerebral aneurysm.  She will be followed with Dr. Kelli Hope and with Demetrio Lapping, P.A., to monitor this at time of  discharge.   Problem 5.  History of asthma.  The patient had no significant asthma  exacerbation while in rehab.  She remained on Advair 1 puff b.i.d.   Problem 6.  Tinea versicolor.  She was started on Nizoral cream on 09/24/02.   Problem 7.   Besides arthritis, there is no other major medical issues that  occurred while in rehab.  She received Motrin 600 mg p.o. q.i.d. x3 days.   LABORATORY DATA:  Hemoglobin 12.3, hematocrit 35.7, WBC 6.9, platelet count  326.  Latest sodium 143, potassium 4.4, chloride 110, CO2 25, glucose 107,  BUN 17, creatinine 1.1, AST 23, ALT 22, B12 545.   At the time of discharge the patient's physical exam was significant for,  the blood pressure was stable, 150/80, pulse 86, respiratory rate 18,  temperature 98.2.  Neurologic exam -- the patient still had some dysarthria.  Mobility wise -- she gained full strength in her upper extremities and lower  extremities.  PT report on the patient is ambulating modified independent  greater than 100 feet with cane, and using a right ____________.  Transfers  with standing modified independently, bed mobility modified independently.  The patient performs most activities of daily living modified independently.  The patient made good progress with all goals.  She achieved also five long  term goals.  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  Continue to be motivated. The patient made excellent progress and discharged  to home with minimal supervision from family.  ___________ she has made  excellent progress with continued right lower extremity strength, balance  and gait.  Resume PT for right lower balance and gait and training.  Continue to be independent.  Overall the patient has met all goals and is  ready for discharged to home today.  The patient is at a close supervision  level for continued improvement and endurance.  The patient discharged to  home with her daughter.   DISCHARGE MEDICATIONS:  1. Elavil 1 tablet daily. 2. Aspirin 325 mg daily.  3. Clonidine 0.1 mg 1 tablet twice  daily.  4. Metoprolol 1 tablet twice daily.  5. Lasix 20 mg 1 tablet daily.  6. Altace 10 mg 1 tablet twice daily.  7. Nizoral cream apply to face twice daily.  8. Resume albuterol and Advair.  9. Multivitamin 1 tablet per daily.  10.      Pain management with Motrin  and Tylenol.   DISCHARGE ACTIVITIES:  No drinking, driving, no smoking, using cane  ___________.   DISCHARGE DIET:  Low salt.   She had advance in ___________ PT/OT with speech.   FOLLOW UP:  She is to follow up with Dr. Kelli Hope within 4-6 weeks  to monitor the aneurysm.  Follow-up with Ranelle Oyster, M.D. on 2/18 at  10:30.  Follow-up with her primary care Lindsey Hommel Dr. Fannie Knee Dinkerd within 4-6  weeks.                                                Junie Bame, P.A.    LH/MEDQ  D:  10/05/2002  T:  10/05/2002  Job:  [id number]   cc:   Casimiro Needle L. Thad Ranger, M.D.  [address]  [address]  [place]  [place] [address]  Fax: [phone]   Fannie Knee M.D. [place]   Ranelle Oyster, M.D.  [address] [place]  [place] [address]  Fax: [phone]

## 2011-02-23 NOTE — H&P (Signed)
NAME:  Pamela Hess, Pamela Hess                     ACCOUNT NO.:  [id number]   MEDICAL RECORD NO.:  [id number]                   PATIENT TYPE:  INP   LOCATION:  4735                                 FACILITY:  [facility]   PHYSICIAN:  Deanna Artis. Sharene Skeans, M.D.           DATE OF BIRTH:  12-24-1945   DATE OF ADMISSION:  09/21/2002  DATE OF DISCHARGE:                                HISTORY & PHYSICAL   CHIEF COMPLAINT:  Can't speak - legs are weak.   HISTORY OF PRESENT ILLNESS:  The patient is a 65 year old, right-handed  widowed African-American woman who had the onset of confusion and difficulty  with her speech on Saturday afternoon.  She was at the [facility] and had  difficulty getting her words out.  This continued on Sunday without change.  She refused to come to the hospital for evaluation, despite her daughter  voicing that she is having a stroke.  This morning when she got up and tried to get out of bed, she fell and said  that her legs gave out.  She felt that the right was worse than the left.  She was brought to the hospital or evaluation.   PAST MEDICAL HISTORY:  1. Hypertension.  2. Gout.  3. Asthma.  4. Depression/anxiety.  5. Breast cancer.  6. Uterine fibroids.  7. Hammertoes.  8. Bunions.  9. Ganglion of her right wrist.  10.      Dental caries.   PAST SURGICAL HISTORY:  1. Left breast biopsy, with negative regional lymph nodes.  Irradiation     treatment 13 years ago.  The patient is in complete remission.  2. Partial hysterectomy and right oophorectomy.  The patient had fibroids.  3. Surgery on her feet.  4. Removal of a ganglion cyst.  5. Removal of teeth.   REVIEW OF SYSTEMS:  The patient had influenza last week, and has a residual  cough.  She has not run a fever recently.  Her appetite has been fine.  She  has been sleeping well.  She has not complained of chest pain, palpitations,  or shortness of breath.  The patient has had no nausea, vomiting, or  diarrhea.  No bleeding dyscrasias, no sore throat, no dysuria or hematuria.  No rash.  No joint or back pain.  She has had some swelling of her legs.  She has had problems with gouty arthritis in her joints.  NEUROLOGIC:  The  patient has not had headaches, syncope, head injury, no dizziness.  The  review of systems is otherwise negative except as noted above.   MEDICATIONS:  1. Furosemide 20 mg q.d.  2. Metoprolol 50 mg, 1-1/2 p.o. b.i.d.  3. Colchicine 0.6 mg b.i.d.  4. Amitriptyline 25 mg q. bedtime.  This was to be increased to 50 mg.  5. Clonidine 0.2 mg b.i.d.  6. Advair 100/50, two puffs q.d.  7. Motrin 200 mg t.i.d.  8. Albuterol one to  two puffs b.i.d.  9. Extra strength Tylenol 500 mg p.r.n.   ALLERGIES:  No known drug allergies.   FAMILY HISTORY:  Mother died in her 3s of cancer.  Father died in his 30s  of a cerebral aneurysm.  A brother died in his 63s of a pedestrian accident.  One of her children died of a gunshot wound in 03/06/1992.  She has two living  children, one female and one female.   SOCIAL HISTORY:  The patient's died at age 72 of cancer.  She did not  remarry.  She works at a [employer].  She has an  11th grade education.  No use of tobacco or alcohol.   PHYSICAL EXAMINATION:  VITAL SIGNS:  Blood pressure 154/93, resting pulse  51, respirations 20, temperature 97.9 degrees.  Pulse oximetry 95% on 2 L of  oxygen.  HEENT:  No signs of infection.  NECK:  No bruits, a supple neck.  LUNGS:  Clear to auscultation.  HEART:  No murmurs, pulses normal.  ABDOMEN:  Soft, bowel sounds normal.  No hepatosplenomegaly.  EXTREMITIES:  She complains of swelling in her legs, but I see no swelling  or acute arthritis.  She has no localized tenderness or deformity.  NEUROLOGIC:  The patient is awake and alert.  She has paraphasias and  neologisms, dysfluency.  She follows commands.  Cranial nerves:  Round  reactive pupils.  Visual fields full.   Extraocular movements full and  conjugate.  She has a mild left central 7th.  She has midline tongue and  uvula.  Air conduction is greater than bone conduction bilaterally.  Motor examination:  The patient had normal strength, tone, and mass in both  upper extremities, but she has clumsy fine motor movements on the right.  There is no drift.  In the lower extremities she has 4/5 strength in her  proximal muscles, and she does not wiggle her toes as well on the right foot  as she does on the left, which is entirely normal.  Sensation:  She had no hemisensory.  She had good stereoagnosis.  Cerebellar  examination:  She moves the left side better than the right, with fine motor  movements and rapid repetitive movements.  Gait:  Is right hemiparetic.  She  falls to the right.  The leg is quite stiff.  She requires assistance when  she gets up.  Deep tendon reflexes showed a mild left predominance.  The  patient has bilateral flexor plantar responses.   IMPRESSION:  1. Left middle cerebral artery infarction.  I expect that this involves the     insular cortex, and does not involve the motor cortex nearly as much,     because the hemiparesis is quite mild.  I do not see this on a CT scan.     The cranial CT scan appears entirely normal to me.  This is strange,     given that she had symptoms for 48 hours - code #434.11.  2. Hypertension - 404.10.   PLAN:  The patient will have an evaluation, including an MRI/MRA, a 2-D  echocardiogram, carotid Dopplers, serum homocysteine, lipid panel,  hemoglobin A1c.  We will observe her blood pressure without treatment at  this  time.  She will be placed on IV heparin until her workup is complete.  She  will have a speech therapy evaluation, not only for dysphagia, but also for  dysphasia.  Also PT and OT.  She  will likely be a rehabilitation candidate.  She will be admitted to the neurology service under the care of Dr. Casimiro Needle  L. Reynolds.                                                 Deanna Artis. Sharene Skeans, M.D.    [facility]  D:  09/21/2002  T:  09/21/2002  Job:  [id number]   cc:   c/o Willis Modena, M.D. [facility]

## 2011-02-23 NOTE — Assessment & Plan Note (Signed)
Pamela Hess is back regarding her stroke and neuropathic pain. She had  been doing very well on her current regimen which includes Cymbalta 60 mg  daily and Darvocet one at bedtime. 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 She seems to be happy with this. She is doing well from a  performance standpoint and seems to have moved on with her life. She reports  that her pain overall is a 3/10. It appears to be worse on days that she is  on her feet more. The pain peaks at nighttime. She tried a couple of nights  without the Darvocet and the pain seems to wake her up from sleep. The pain  generally improves with rest, some times with heat and ice.   REVIEW OF SYSTEMS:  The patient denies any neurologic, psychiatric,  constitutional, GU, GI, or cardiorespiratory complaints today.   SOCIAL HISTORY:  The patient is single and works as mentioned above.   PHYSICAL EXAMINATION:  VITAL SIGNS: Blood pressure 161/96, pulse 51,  respiratory rate 16, saturating 96% on room air.  GENERAL: The patient is pleasant, in no acute distress. She is alert and  oriented time three.  NEUROLOGIC: Affect is bright and appropriate. Gait is generally stable. No  obvious antalgia noted. Coordination is fair. Reflexes are 2+ on the right  and 2+ on the left. Sensation is decreased in the lower extremities to  pinprick and light touch. Cognitively, she is appropriate. Cranial nerve  examination is unremarkable. Motor function is generally 5/5.  HEART: Regular rate and rhythm.  LUNGS: Clear.  ABDOMEN: Soft and nontender.   ASSESSMENT:  1.  History of left cerebrovascular accident.  2.  Neuropathic lower extremity pain.  3.  Questionable history of gout.   PLAN:  1.  Continue Cymbalta 60 mg daily.  2.  Maintain Darvocet at bedtime. She may taper off this as tolerated,      although I am okay with her using Darvocet at bedtime long-term.  3.  Maintain colchicine and allopurinol as she has  before.  4.  Continue Neurontin 300 mg q.h.s.  5.  Recommend follow up with PCP for hypertension with follow up of her      systolic and diastolic pressures.  6.  Will see the patient back in 12 months time.      Ranelle Oyster, M.D.  Electronically Signed     ZTS/MedQ  D:  01/15/2006 14:29:55  T:  01/15/2006 18:25:21  Job #:  [id number]   cc:   Pamela Hess, M.D.

## 2011-02-23 NOTE — Assessment & Plan Note (Signed)
REASON FOR VISIT:  I last saw Mrs. Latella in April 2004.  Mrs.  Mcvey was with [place] status post a left parietal infarct and subsequent  right-sided weakness and gait deficits.  The patient had been doing fairly  well from a neurological standpoint.  She had complained of some numbness in  her feet for some time after the stroke.  She does report increasing pain in  the toes on both sides, particularly the great toes.  She had previous  bunion surgery on the toes back in 1986.  She has been seen apparently by a  couple of physicians here in town, one or both of which were pain  physicians.  She received some injections in the toes with moderate results.  She notes that pain is worse in the feet when she walks for long periods of  time.  She notes color changes in the foot approximately mid foot down to  the toes.  The big toes often bother her but she had pain throughout and  numbness on the sole of the foot.  Pain is worse with walking and better  with resting.  She notes some swelling of the first toes.  She has taken  some Vicodin with relief as well as p.r.n. hydrocodone.  She has not really  had pain for the last 3 days.  The patient does note some hypersensitivity  and temperature change in the feet, particularly when they bother her.   REVIEW OF SYSTEMS:  The patient denies any heart palpitations, chest pain,  shortness of breath, or cold symptoms.  No seizures, weakness, dizziness in  general.  No spasms or blurred vision.  She is chronically anxious.  No  nausea, vomiting, diarrhea, constipation, or incontinence.   PHYSICAL EXAMINATION TODAY:  Blood pressure 122/72, pulse is 90, she is  saturating 100% on room air.  Affect was generally appropriate although she  was a bit anxious.  She followed all commands without difficulties.  Strength was nearly 5/5 throughout.  She walked with some reluctance on  either side but no obvious antalgia noted.  Arches were slightly flat  on  both feet.  She had some discoloration from essentially the distal  metatarsals down through the toes.  The feet were a little bit cooler than  the remainder of her extremity and in comparison to her hands.  Pulses were  trace to not palpable in the feet.  The patient had normal range of motion  of the feet and ankles.  She did have the prior surgical scars along the  first toe.  Cranial nerve exam was appropriate.  Cognitively she was intact.   ASSESSMENT:  1. Status post left cerebrovascular accident.  2. Bilateral foot pain which could be osteoarthritic in nature although     cannot rule out gout which she has had a history of, as well as vascular     compromise.  3. Cannot rule out reflex sympathetic dystrophy.   PLAN:  1. I would like to send the patient for arterial Dopplers of bilateral lower     extremities to rule out any obvious arterial circulation difficulties.  2. Restart her on her Neurontin getting up to 300 mg t.i.d. over 1 week's     time.  3. She can use her Vicodin for breakthrough pain 5/500.  4. Will see her back pending the results of her test in the next 2-4 weeks.      Ranelle Oyster, M.D.   ZTS/MedQ  D:  11/09/2003 15:55:32  T:  11/09/2003 17:07:15  Job #:  [id number]

## 2011-02-23 NOTE — Assessment & Plan Note (Signed)
HISTORY:  The patient is here regarding her foot pain.  We started her back  on her Neurontin as of the last visit, and she has had essentially a  resolution of her pain.  She had arterial Dopplers done, which by her report  were negative.  I have not received the formal report back here yet.  She  maintains on her Colchicine and allopurinol for gout.  She uses Elavil 50 mg  q.h.s. for sleep.  Her foot pain is down to a 4-5/10.  She is feeling well  enough that she wants to return to work.   REVIEW OF SYSTEMS:  The patient denies any chest pain, shortness of breath,  heart palpitations, no cold or flu symptoms.  She has had no seizures,  weakness, or dizziness.  There is still some numbness in the feet.  Denies  nausea, vomiting, reflux, diarrhea, or constipation.   PHYSICAL EXAMINATION:  VITAL SIGNS:  Blood pressure 130/83, pulse 48,  respirations 16.  She is saturating 98% on room air.  NEUROLOGIC:  The patient follows all commands without problems.  Cranial  nerve examination was intact.  Strength is 5/5 throughout all four  extremities.  The patient walks barefooted in the room today with me without  difficulties.  The feet remain a bit cooler than the rest of her extremity,  but pulses were trace to palpable.  The patient had a normal range of motion  and strength at both feet.  Old surgical scars were noted.   ASSESSMENT:  1. History of left cerebrovascular accident.  2. Bilateral foot pain which may have been neuropathic in nature, that has     responded well to the Neurontin.   PLAN:  1. The patient will continue with her Neurontin at the current dose of 300     mg t.i.d.  2. She may maintain her Elavil at 50 mg q.h.s.  3. She may use Darvocet for severe break-through pain.  4. She will be maintained on her allopurinol and Colchicine for gout.  5. I will see her back in approximately six months' time.      Ranelle Oyster, M.D.   ZTS/MedQ  D:  12/07/2003 14:27:51   T:  12/07/2003 14:45:56  Job #:  [id number]

## 2011-02-23 NOTE — Assessment & Plan Note (Signed)
INTERVAL HISTORY:  Chen is here regarding her foot pain which we felt is  neuropathic in nature.  We began her on Cymbalta at last visit which we  increased to 60 mg once daily which seems to have helped her.  She is on  Neurontin which she is taking 300 mg of in the morning and evening and 600  mg at bedtime.  She states that her pain is much improved.  She rates her  pain at a 3-4/10 at best.  She is working part time doing custodial work and  seems to be enjoying that.  She still has numbness in her feet.  She seems  to be fairly happy with how her feet are doing at this point.  X-rays of her  feet were negative.   REVIEW OF SYSTEMS:  Patient denies any chest pain, shortness of breath,  cold, flu, or coughing.  She does have occasional wheezing.  Denies new  weakness, spasms, problems with confusion, sleep or headaches.  Denies  nausea, vomiting, diarrhea, reflux, constipation, bowel or bladder  incontinence.  Denies fever, chills, weight changes, sweating, problems with  bruising or high sugars.   PHYSICAL EXAMINATION:  Blood pressure first check was 207/176 on the  D[place].  I checked this manually as well and we recorded a pressure of  approximately 180/100.  Pulse was 60.  She was saturating 96% on room air.  Patient denied any dizziness or problems with vision today.  Her affect was  normal although albeit a little anxious as usual.  Her gait was slightly  wide based and favored the right side.  Appearance was well kept.  Heart was  regular rate and rhythm and lungs were clear.  Abdomen was soft, nontender.  Extremities showed no clubbing, cyanosis, or edema.  On further neurological  examination cranial nerves II-XII were intact.  Speech was improved.  Motor  exam was 5/5 bilaterally.  She had bilateral sensory decrease to pinprick  and light touch in the feet and ankles to the toes.  Minimal pain in the  feet was noted today.  Patient had normal skin color, no breakdown  seen.   ASSESSMENT:  1.  History of left cerebrovascular accident.  2.  Bilateral foot pain which appears neuropathic.  3.  Hypertension.   PLAN:  1.  We will maintain the Neurontin and Cymbalta at their current doses      essentially 1200 mg daily for the Neurontin and 60 mg daily for the      Cymbalta.  2.  She may use the Darvocet for breakthrough pain every 1-3 days.  3.  Regarding her hypertension, I had the patient take a half of one of her      0.2 mg clonidine while in the room.  I also increased her Altace to      twice a day.  We will call her family physician Dr. Margarette Canada to see if      she would like to see the patient in the office.  4.  I will see the patient back in 6 months' time.       ZTS/MedQ  D:  07/14/2004 10:22:49  T:  07/14/2004 13:25:17  Job #:  [id number]   cc:   Fannie Knee Drinkard, N.P.

## 2011-08-16 ENCOUNTER — Other Ambulatory Visit (HOSPITAL_COMMUNITY): Payer: Self-pay | Admitting: Internal Medicine

## 2011-08-16 DIAGNOSIS — Z1231 Encounter for screening mammogram for malignant neoplasm of breast: Secondary | ICD-10-CM

## 2011-09-24 ENCOUNTER — Ambulatory Visit (HOSPITAL_COMMUNITY)
Admission: RE | Admit: 2011-09-24 | Discharge: 2011-09-24 | Disposition: A | Discharge status: Home / Self Care | Payer: Medicare Other | Source: Ambulatory Visit | Attending: Internal Medicine | Admitting: Internal Medicine

## 2011-09-24 DIAGNOSIS — Z1231 Encounter for screening mammogram for malignant neoplasm of breast: Secondary | ICD-10-CM | POA: Insufficient documentation

## 2012-08-21 ENCOUNTER — Other Ambulatory Visit (HOSPITAL_COMMUNITY): Payer: Self-pay | Admitting: Internal Medicine

## 2012-08-21 DIAGNOSIS — I739 Peripheral vascular disease, unspecified: Secondary | ICD-10-CM

## 2012-08-21 DIAGNOSIS — I70219 Atherosclerosis of native arteries of extremities with intermittent claudication, unspecified extremity: Secondary | ICD-10-CM

## 2012-09-02 ENCOUNTER — Encounter (HOSPITAL_COMMUNITY): Payer: Medicare Other

## 2012-09-17 ENCOUNTER — Ambulatory Visit (HOSPITAL_COMMUNITY)
Admission: RE | Admit: 2012-09-17 | Discharge: 2012-09-17 | Disposition: A | Discharge status: Home / Self Care | Payer: Medicare Other | Source: Ambulatory Visit | Attending: Internal Medicine | Admitting: Internal Medicine

## 2012-09-17 DIAGNOSIS — I70219 Atherosclerosis of native arteries of extremities with intermittent claudication, unspecified extremity: Secondary | ICD-10-CM

## 2012-09-17 DIAGNOSIS — I739 Peripheral vascular disease, unspecified: Secondary | ICD-10-CM

## 2012-09-17 NOTE — Progress Notes (Signed)
BLE arterial duplex completed. Jamie Belger D  

## 2012-10-08 DIAGNOSIS — C801 Malignant (primary) neoplasm, unspecified: Secondary | ICD-10-CM

## 2012-10-08 HISTORY — DX: Malignant (primary) neoplasm, unspecified: C80.1

## 2013-05-14 ENCOUNTER — Other Ambulatory Visit (HOSPITAL_COMMUNITY): Payer: Self-pay | Admitting: Internal Medicine

## 2013-05-14 DIAGNOSIS — Z1231 Encounter for screening mammogram for malignant neoplasm of breast: Secondary | ICD-10-CM

## 2013-05-27 ENCOUNTER — Ambulatory Visit (HOSPITAL_COMMUNITY): Payer: Medicare Other

## 2013-06-04 ENCOUNTER — Ambulatory Visit (HOSPITAL_COMMUNITY)
Admission: RE | Admit: 2013-06-04 | Discharge: 2013-06-04 | Disposition: A | Discharge status: Home / Self Care | Payer: Medicare Other | Source: Ambulatory Visit | Attending: Internal Medicine | Admitting: Internal Medicine

## 2013-06-04 DIAGNOSIS — Z1231 Encounter for screening mammogram for malignant neoplasm of breast: Secondary | ICD-10-CM | POA: Insufficient documentation

## 2013-06-10 ENCOUNTER — Other Ambulatory Visit: Payer: Self-pay | Admitting: Internal Medicine

## 2013-06-10 DIAGNOSIS — R921 Mammographic calcification found on diagnostic imaging of breast: Secondary | ICD-10-CM

## 2013-06-18 ENCOUNTER — Other Ambulatory Visit: Payer: Self-pay | Admitting: Internal Medicine

## 2013-06-18 ENCOUNTER — Ambulatory Visit
Admission: RE | Admit: 2013-06-18 | Discharge: 2013-06-18 | Disposition: A | Discharge status: Home / Self Care | Payer: Medicare Other | Source: Ambulatory Visit | Attending: Internal Medicine | Admitting: Internal Medicine

## 2013-06-18 DIAGNOSIS — R921 Mammographic calcification found on diagnostic imaging of breast: Secondary | ICD-10-CM

## 2013-06-29 ENCOUNTER — Ambulatory Visit
Admission: RE | Admit: 2013-06-29 | Discharge: 2013-06-29 | Disposition: A | Discharge status: Home / Self Care | Payer: Medicare Other | Source: Ambulatory Visit | Attending: Internal Medicine | Admitting: Internal Medicine

## 2013-06-29 DIAGNOSIS — R921 Mammographic calcification found on diagnostic imaging of breast: Secondary | ICD-10-CM

## 2013-07-06 ENCOUNTER — Ambulatory Visit (INDEPENDENT_AMBULATORY_CARE_PROVIDER_SITE_OTHER): Payer: Medicare Other | Admitting: General Surgery

## 2013-07-06 ENCOUNTER — Encounter (INDEPENDENT_AMBULATORY_CARE_PROVIDER_SITE_OTHER): Payer: Self-pay | Admitting: General Surgery

## 2013-07-06 ENCOUNTER — Ambulatory Visit (INDEPENDENT_AMBULATORY_CARE_PROVIDER_SITE_OTHER): Payer: Self-pay | Admitting: General Surgery

## 2013-07-06 VITALS — BP 118/72 | HR 64 | Temp 97.2°F | Resp 14 | Ht 69.0 in | Wt 128.8 lb

## 2013-07-06 DIAGNOSIS — R928 Other abnormal and inconclusive findings on diagnostic imaging of breast: Secondary | ICD-10-CM

## 2013-07-06 DIAGNOSIS — R921 Mammographic calcification found on diagnostic imaging of breast: Secondary | ICD-10-CM

## 2013-07-06 NOTE — Progress Notes (Signed)
Patient ID: Pamela Hess, female   DOB: [birth year], 67 y.o.   MRN: [id number]  Chief Complaint  Patient presents with  . New Evaluation    eval Rt br clacifications    HPI Pamela Hess is a 67 y.o. female.  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 She states she has no residual from the stroke. She has no symptoms referable to either breast. She underwent a screening mammogram that showed her to have 2 clusters of calcifications each measuring 2 mm in the lateral aspect of the right breast. These are different than when compared to prior exams. She was recommended a stereotactic biopsy which not could not be completed due to her breast size and location of the calcifications. She was then referred for surgical evaluation. Past Medical History  Diagnosis Date  . Hyperlipidemia   . Hypertension   . Asthma   . Diabetes mellitus without complication   . Stroke     History reviewed. No pertinent past surgical history.  Family History  Problem Relation Age of Onset  . Cancer Mother     colon    Social History History  Substance Use Topics  . Smoking status: Current Every Day Smoker -- 0.25 packs/day  . Smokeless tobacco: Never Used  . Alcohol Use: No    Not on File  Current Outpatient Prescriptions  Medication Sig Dispense Refill  . ADVAIR DISKUS 250-50 MCG/DOSE AEPB       . amLODipine (NORVASC) 10 MG tablet       . atorvastatin (LIPITOR) 20 MG tablet       . enalapril (VASOTEC) 5 MG tablet       . gabapentin (NEURONTIN) 300 MG capsule       . glimepiride (AMARYL) 2 MG tablet       . PROAIR HFA 108 (90 BASE) MCG/ACT inhaler        No current facility-administered medications for this visit.    Review of Systems Review of Systems  Constitutional: Negative for fever, chills and unexpected  weight change.  HENT: Negative for hearing loss, congestion, sore throat, trouble swallowing and voice change.   Eyes: Negative for visual disturbance.  Respiratory: Negative for cough and wheezing.   Cardiovascular: Negative for chest pain, palpitations and leg swelling.  Gastrointestinal: Negative for nausea, vomiting, abdominal pain, diarrhea, constipation, blood in stool, abdominal distention and anal bleeding.  Genitourinary: Negative for hematuria, vaginal bleeding and difficulty urinating.  Musculoskeletal: Negative for arthralgias.  Skin: Negative for rash and wound.  Neurological: Positive for headaches. Negative for seizures and syncope.  Hematological: Negative for adenopathy. Does not bruise/bleed easily.  Psychiatric/Behavioral: Negative for confusion.    Blood pressure 118/72, pulse 64, temperature 97.2 F (36.2 C), temperature source Temporal, resp. rate 14, height 5\' 9"  (1.753 m), weight 128 lb 12.8 oz (58.423 kg).  Physical Exam Physical Exam  Constitutional: She appears well-developed and well-nourished.  Eyes: No scleral icterus.  Neck: Neck supple.  Cardiovascular: Normal rate, regular rhythm and normal heart sounds.   Pulmonary/Chest: Effort normal and breath sounds normal. Right breast exhibits no inverted nipple, no mass, no nipple discharge, no skin change and no tenderness. Left breast exhibits no inverted nipple, no mass, no nipple discharge, no skin change and no tenderness.  Abdominal: Soft. Normal appearance.  Lymphadenopathy:  She has no cervical adenopathy.    She has no axillary adenopathy.       Right: No supraclavicular adenopathy present.       Left: No supraclavicular adenopathy present.    Data Reviewed The patient returns for attempted right breast stereotactic core  biopsy of small clusters of calcifications located within the right  breast laterally. Due to the patient's breast size and location of  calcifications, the calcifications could  not be biopsied  stereotactically. Following discussion with the patient, the  patient has been scheduled for surgical consultation for possible  excisional biopsy with Dr. Dwain Sarna on 07/06/2013.  Addended by: Rolla Plate, M.D. on 06/29/2013 12:42:47.  **END ADDENDUM** SIGNED BY: Rolla Plate, M.D.      Study Result    *RADIOLOGY REPORT*  Clinical Data: Abnormal right screening mammogram  DIGITAL DIAGNOSTIC RIGHT MAMMOGRAM  Comparison: None.  Findings:  ACR Breast Density Category c: The breast tissue is  heterogeneously dense, which may obscure small masses.  Magnification views of the lateral aspect of the right breast were  obtained. There are two clusters of calcifications each measuring  2 mm. The calcifications vary in size and shape there are  developing when compared to the prior exams. There is no  associated mass.  IMPRESSION:  Two clusters of suspicious calcifications in the lateral aspect of  the right breast.  RECOMMENDATION:  I recommend a stereotactic biopsy of the anterior cluster of  calcifications. If these are pathologically benign short-term  interval follow-up of the more posterior calcifications in 6 months  would be suggested.  I have discussed the findings and recommendations with the patient.  Results were also provided in writing at the conclusion of the  visit. If applicable, a reminder letter will be sent to the  patient regarding her next appointment.  BI-RADS CATEGORY 4: Suspicious abnormality - biopsy should be  considered.     Assessment    Right breast calcifications, BiRads 4     Plan    Right breast wire-guided excision  There are new calcifications that are BiRads 4 lesions that are not amenable to stereotactic biopsy due to the patient's breast size and locations of the calcifications. She came in today we discussed either observation or wire-guided excision. I recommended to her excision. We discussed excision to prove  that this is not a cancer. We discussed the wire-guided excision and the risks of bleeding, infection, or further surgery. I'm going to get clearance from her medical doctor as well as figure out exactly what medicines she also on as she does not know today and then we'll get her scheduled.        Kinser Fellman 07/06/2013, 2:34 PM

## 2013-07-23 ENCOUNTER — Telehealth (INDEPENDENT_AMBULATORY_CARE_PROVIDER_SITE_OTHER): Payer: Self-pay | Admitting: *Deleted

## 2013-07-23 NOTE — Telephone Encounter (Signed)
Called pt to notify her that we are still waiting on the clearance note from Dr Avbuere's office but they are faxing the report to [place] today. I will call the pt once we receive the clearance and I will turn pt's surgical orders in at that time once we have the clearance. The pt understands.

## 2013-07-23 NOTE — Telephone Encounter (Signed)
Patient called to ask if she have received her clearance for surgery from her PMD yet.  She states she went last Monday and he told her she was released and he would send [place] something.  Explained that I would send a message to [facility] to check with her to see if we have it so orders for surgery can be done.  Explained that one of [place] would give her a call back with an update.  Patient states understanding and agreeable at this time.

## 2013-07-23 NOTE — Telephone Encounter (Signed)
Called to check on the status of pt's cardiac clearance b/c the pt had called to let us know that she had been cleared. Dr Avbuere's office does have the pt being cleared but they have not faxed the report to our office yet. They will work on faxing [place] the clearance so we can get pt scheduled for surgery.

## 2013-07-24 ENCOUNTER — Encounter (HOSPITAL_BASED_OUTPATIENT_CLINIC_OR_DEPARTMENT_OTHER): Payer: Self-pay | Admitting: *Deleted

## 2013-07-24 ENCOUNTER — Encounter (INDEPENDENT_AMBULATORY_CARE_PROVIDER_SITE_OTHER): Payer: Self-pay

## 2013-07-24 NOTE — Telephone Encounter (Signed)
Called pt to notify her that we received the cardiac clearance and I turned her surgical orders into surgery scheduling. The pt understands.

## 2013-07-24 NOTE — Progress Notes (Signed)
Will see if dr Lenna Sciara got labs and ekg last week-pt said he did

## 2013-07-28 ENCOUNTER — Ambulatory Visit
Admission: RE | Admit: 2013-07-28 | Discharge: 2013-07-28 | Disposition: A | Discharge status: Home / Self Care | Payer: Medicare Other | Source: Ambulatory Visit | Attending: General Surgery | Admitting: General Surgery

## 2013-07-28 ENCOUNTER — Ambulatory Visit (HOSPITAL_BASED_OUTPATIENT_CLINIC_OR_DEPARTMENT_OTHER)
Admission: RE | Admit: 2013-07-28 | Discharge: 2013-07-28 | Disposition: A | Discharge status: Home / Self Care | Payer: Medicare Other | Source: Ambulatory Visit | Attending: General Surgery | Admitting: General Surgery

## 2013-07-28 ENCOUNTER — Encounter (HOSPITAL_BASED_OUTPATIENT_CLINIC_OR_DEPARTMENT_OTHER): Payer: Medicare Other | Admitting: Anesthesiology

## 2013-07-28 ENCOUNTER — Encounter (HOSPITAL_BASED_OUTPATIENT_CLINIC_OR_DEPARTMENT_OTHER)
Admission: RE | Disposition: A | Discharge status: Home / Self Care | Payer: Self-pay | Source: Ambulatory Visit | Attending: General Surgery

## 2013-07-28 ENCOUNTER — Ambulatory Visit (HOSPITAL_BASED_OUTPATIENT_CLINIC_OR_DEPARTMENT_OTHER): Payer: Medicare Other | Admitting: Anesthesiology

## 2013-07-28 ENCOUNTER — Encounter (HOSPITAL_BASED_OUTPATIENT_CLINIC_OR_DEPARTMENT_OTHER): Payer: Self-pay | Admitting: Anesthesiology

## 2013-07-28 DIAGNOSIS — M109 Gout, unspecified: Secondary | ICD-10-CM | POA: Insufficient documentation

## 2013-07-28 DIAGNOSIS — Z79899 Other long term (current) drug therapy: Secondary | ICD-10-CM | POA: Insufficient documentation

## 2013-07-28 DIAGNOSIS — E785 Hyperlipidemia, unspecified: Secondary | ICD-10-CM | POA: Insufficient documentation

## 2013-07-28 DIAGNOSIS — D0590 Unspecified type of carcinoma in situ of unspecified breast: Secondary | ICD-10-CM | POA: Insufficient documentation

## 2013-07-28 DIAGNOSIS — R921 Mammographic calcification found on diagnostic imaging of breast: Secondary | ICD-10-CM

## 2013-07-28 DIAGNOSIS — R920 Mammographic microcalcification found on diagnostic imaging of breast: Secondary | ICD-10-CM | POA: Insufficient documentation

## 2013-07-28 DIAGNOSIS — F17200 Nicotine dependence, unspecified, uncomplicated: Secondary | ICD-10-CM | POA: Insufficient documentation

## 2013-07-28 DIAGNOSIS — I739 Peripheral vascular disease, unspecified: Secondary | ICD-10-CM | POA: Insufficient documentation

## 2013-07-28 DIAGNOSIS — Z8673 Personal history of transient ischemic attack (TIA), and cerebral infarction without residual deficits: Secondary | ICD-10-CM | POA: Insufficient documentation

## 2013-07-28 DIAGNOSIS — J45909 Unspecified asthma, uncomplicated: Secondary | ICD-10-CM | POA: Insufficient documentation

## 2013-07-28 DIAGNOSIS — E119 Type 2 diabetes mellitus without complications: Secondary | ICD-10-CM | POA: Insufficient documentation

## 2013-07-28 DIAGNOSIS — I10 Essential (primary) hypertension: Secondary | ICD-10-CM | POA: Insufficient documentation

## 2013-07-28 HISTORY — DX: Polyneuropathy, unspecified: G62.9

## 2013-07-28 HISTORY — DX: Complete loss of teeth, unspecified cause, unspecified class: K08.109

## 2013-07-28 HISTORY — DX: Presence of spectacles and contact lenses: Z97.3

## 2013-07-28 HISTORY — DX: Personal history of other diseases of the circulatory system: Z86.79

## 2013-07-28 HISTORY — DX: Presence of dental prosthetic device (complete) (partial): Z97.2

## 2013-07-28 HISTORY — DX: Other specified postprocedural states: Z98.890

## 2013-07-28 HISTORY — PX: BREAST BIOPSY: SHX20

## 2013-07-28 HISTORY — DX: Chronic kidney disease, unspecified: N18.9

## 2013-07-28 LAB — GLUCOSE, CAPILLARY
Glucose-Capillary: 82 mg/dL (ref 70–99)
Glucose-Capillary: 92 mg/dL (ref 70–99)

## 2013-07-28 SURGERY — BREAST BIOPSY WITH NEEDLE LOCALIZATION
Anesthesia: General | Site: Breast | Laterality: Right | Wound class: Clean

## 2013-07-28 MED ORDER — OXYCODONE HCL 5 MG PO TABS: 5.0000 mg | ORAL_TABLET | Freq: Once | ORAL | Status: DC | PRN

## 2013-07-28 MED ORDER — LIDOCAINE HCL (CARDIAC) 20 MG/ML IV SOLN
INTRAVENOUS | Status: DC | PRN
  Administered 2013-07-28: 75 mg via INTRAVENOUS

## 2013-07-28 MED ORDER — FENTANYL CITRATE 0.05 MG/ML IJ SOLN
INTRAMUSCULAR | Status: AC
  Filled 2013-07-28: qty 2

## 2013-07-28 MED ORDER — BUPIVACAINE HCL (PF) 0.25 % IJ SOLN
INTRAMUSCULAR | Status: AC
  Filled 2013-07-28: qty 30

## 2013-07-28 MED ORDER — MIDAZOLAM HCL 2 MG/2ML IJ SOLN: 1.0000 mg | INTRAMUSCULAR | Status: DC | PRN

## 2013-07-28 MED ORDER — EPHEDRINE SULFATE 50 MG/ML IJ SOLN
INTRAMUSCULAR | Status: DC | PRN
  Administered 2013-07-28 (×4): 10 mg via INTRAVENOUS

## 2013-07-28 MED ORDER — ONDANSETRON HCL 4 MG/2ML IJ SOLN: 4.0000 mg | Freq: Four times a day (QID) | INTRAMUSCULAR | Status: DC | PRN

## 2013-07-28 MED ORDER — OXYCODONE HCL 5 MG/5ML PO SOLN: 5.0000 mg | Freq: Once | ORAL | Status: DC | PRN

## 2013-07-28 MED ORDER — CEFAZOLIN SODIUM-DEXTROSE 2-3 GM-% IV SOLR: 2.0000 g | INTRAVENOUS | Status: DC

## 2013-07-28 MED ORDER — CEFAZOLIN SODIUM-DEXTROSE 2-3 GM-% IV SOLR
INTRAVENOUS | Status: AC
  Filled 2013-07-28: qty 50

## 2013-07-28 MED ORDER — FENTANYL CITRATE 0.05 MG/ML IJ SOLN
INTRAMUSCULAR | Status: DC | PRN
  Administered 2013-07-28: 50 ug via INTRAVENOUS

## 2013-07-28 MED ORDER — PROPOFOL 10 MG/ML IV BOLUS
INTRAVENOUS | Status: DC | PRN
  Administered 2013-07-28: 150 mg via INTRAVENOUS

## 2013-07-28 MED ORDER — LACTATED RINGERS IV SOLN
INTRAVENOUS | Status: DC
  Administered 2013-07-28 (×2): via INTRAVENOUS

## 2013-07-28 MED ORDER — FENTANYL CITRATE 0.05 MG/ML IJ SOLN
INTRAMUSCULAR | Status: AC
  Filled 2013-07-28: qty 4

## 2013-07-28 MED ORDER — DEXAMETHASONE SODIUM PHOSPHATE 4 MG/ML IJ SOLN
INTRAMUSCULAR | Status: DC | PRN
  Administered 2013-07-28: 4 mg via INTRAVENOUS

## 2013-07-28 MED ORDER — FENTANYL CITRATE 0.05 MG/ML IJ SOLN
25.0000 ug | INTRAMUSCULAR | Status: DC | PRN
  Administered 2013-07-28: 25 ug via INTRAVENOUS

## 2013-07-28 MED ORDER — MIDAZOLAM HCL 5 MG/5ML IJ SOLN
INTRAMUSCULAR | Status: DC | PRN
  Administered 2013-07-28: 1 mg via INTRAVENOUS

## 2013-07-28 MED ORDER — MIDAZOLAM HCL 2 MG/2ML IJ SOLN
INTRAMUSCULAR | Status: AC
  Filled 2013-07-28: qty 2

## 2013-07-28 MED ORDER — BUPIVACAINE HCL (PF) 0.25 % IJ SOLN
INTRAMUSCULAR | Status: DC | PRN
  Administered 2013-07-28: 10 mL

## 2013-07-28 MED ORDER — FENTANYL CITRATE 0.05 MG/ML IJ SOLN: 50.0000 ug | INTRAMUSCULAR | Status: DC | PRN

## 2013-07-28 MED ORDER — OXYCODONE-ACETAMINOPHEN 5-325 MG PO TABS: 1.0000 | ORAL_TABLET | ORAL | Status: DC | PRN

## 2013-07-28 SURGICAL SUPPLY — 56 items
ADH SKN CLS APL DERMABOND .7 (GAUZE/BANDAGES/DRESSINGS)
APL SKNCLS STERI-STRIP NONHPOA (GAUZE/BANDAGES/DRESSINGS)
APPLIER CLIP 9.375 MED OPEN (MISCELLANEOUS)
APR CLP MED 9.3 20 MLT OPN (MISCELLANEOUS)
BENZOIN TINCTURE PRP APPL 2/3 (GAUZE/BANDAGES/DRESSINGS) IMPLANT
BINDER BREAST LRG (GAUZE/BANDAGES/DRESSINGS) IMPLANT
BINDER BREAST MEDIUM (GAUZE/BANDAGES/DRESSINGS) IMPLANT
BINDER BREAST XLRG (GAUZE/BANDAGES/DRESSINGS) IMPLANT
BINDER BREAST XXLRG (GAUZE/BANDAGES/DRESSINGS) IMPLANT
BLADE SURG 15 STRL LF DISP TIS (BLADE) ×1 IMPLANT
BLADE SURG 15 STRL SS (BLADE) ×2
CANISTER SUCT 1200ML W/VALVE (MISCELLANEOUS) IMPLANT
CHLORAPREP W/TINT 26ML (MISCELLANEOUS) ×2 IMPLANT
CLIP APPLIE 9.375 MED OPEN (MISCELLANEOUS) IMPLANT
COVER MAYO STAND STRL (DRAPES) ×2 IMPLANT
COVER TABLE BACK 60X90 (DRAPES) ×2 IMPLANT
DECANTER SPIKE VIAL GLASS SM (MISCELLANEOUS) IMPLANT
DERMABOND ADVANCED (GAUZE/BANDAGES/DRESSINGS)
DERMABOND ADVANCED .7 DNX12 (GAUZE/BANDAGES/DRESSINGS) IMPLANT
DEVICE DUBIN W/COMP PLATE 8390 (MISCELLANEOUS) ×1 IMPLANT
DRAPE PED LAPAROTOMY (DRAPES) ×2 IMPLANT
DRSG TEGADERM 4X4.75 (GAUZE/BANDAGES/DRESSINGS) ×2 IMPLANT
ELECT COATED BLADE 2.86 ST (ELECTRODE) ×2 IMPLANT
ELECT REM PT RETURN 9FT ADLT (ELECTROSURGICAL) ×2
ELECTRODE REM PT RTRN 9FT ADLT (ELECTROSURGICAL) ×1 IMPLANT
GAUZE SPONGE 4X4 12PLY STRL LF (GAUZE/BANDAGES/DRESSINGS) ×2 IMPLANT
GLOVE BIO SURGEON STRL SZ7 (GLOVE) ×2 IMPLANT
GLOVE BIOGEL PI IND STRL 7.0 (GLOVE) IMPLANT
GLOVE BIOGEL PI IND STRL 7.5 (GLOVE) ×1 IMPLANT
GLOVE BIOGEL PI INDICATOR 7.0 (GLOVE) ×1
GLOVE BIOGEL PI INDICATOR 7.5 (GLOVE) ×1
GLOVE ECLIPSE 6.5 STRL STRAW (GLOVE) ×1 IMPLANT
GOWN PREVENTION PLUS XLARGE (GOWN DISPOSABLE) ×2 IMPLANT
KIT MARKER MARGIN INK (KITS) IMPLANT
NDL HYPO 25X1 1.5 SAFETY (NEEDLE) ×1 IMPLANT
NEEDLE HYPO 25X1 1.5 SAFETY (NEEDLE) ×2 IMPLANT
NS IRRIG 1000ML POUR BTL (IV SOLUTION) ×1 IMPLANT
PACK BASIN DAY SURGERY FS (CUSTOM PROCEDURE TRAY) ×2 IMPLANT
PENCIL BUTTON HOLSTER BLD 10FT (ELECTRODE) ×2 IMPLANT
SLEEVE SCD COMPRESS KNEE MED (MISCELLANEOUS) ×2 IMPLANT
SPONGE LAP 4X18 X RAY DECT (DISPOSABLE) ×2 IMPLANT
STRIP CLOSURE SKIN 1/2X4 (GAUZE/BANDAGES/DRESSINGS) ×1 IMPLANT
SUT MNCRL AB 4-0 PS2 18 (SUTURE) IMPLANT
SUT MON AB 5-0 PS2 18 (SUTURE) IMPLANT
SUT SILK 2 0 SH (SUTURE) ×2 IMPLANT
SUT VIC AB 2-0 SH 27 (SUTURE) ×2
SUT VIC AB 2-0 SH 27XBRD (SUTURE) ×1 IMPLANT
SUT VIC AB 3-0 SH 27 (SUTURE) ×2
SUT VIC AB 3-0 SH 27X BRD (SUTURE) ×1 IMPLANT
SUT VIC AB 5-0 PS2 18 (SUTURE) IMPLANT
SUT VICRYL AB 3 0 TIES (SUTURE) IMPLANT
SYR CONTROL 10ML LL (SYRINGE) ×2 IMPLANT
TOWEL OR 17X24 6PK STRL BLUE (TOWEL DISPOSABLE) ×2 IMPLANT
TOWEL OR NON WOVEN STRL DISP B (DISPOSABLE) ×2 IMPLANT
TUBE CONNECTING 20X1/4 (TUBING) IMPLANT
YANKAUER SUCT BULB TIP NO VENT (SUCTIONS) ×1 IMPLANT

## 2013-07-28 NOTE — Anesthesia Postprocedure Evaluation (Signed)
Anesthesia Post Note  Patient: Pamela Hess [facility]  Procedure(s) Performed: Procedure(s) (LRB): BREAST BIOPSY WITH NEEDLE LOCALIZATION (Right)  Anesthesia type: General  Patient location: PACU  Post pain: Pain level controlled and Adequate analgesia  Post assessment: Post-op Vital signs reviewed, Patient's Cardiovascular Status Stable, Respiratory Function Stable, Patent Airway and Pain level controlled  Last Vitals:  Filed Vitals:   07/28/13 1345  BP: 107/77  Pulse: 73  Temp:   Resp: 20    Post vital signs: Reviewed and stable  Level of consciousness: awake, alert  and oriented  Complications: No apparent anesthesia complications

## 2013-07-28 NOTE — H&P (View-Only) (Signed)
Patient ID: Pamela Hess, female   DOB: [birth year], 67 y.o.   MRN: [id number]  Chief Complaint  Patient presents with  . New Evaluation    eval Rt br clacifications    HPI Pamela Hess is a 67 y.o. female.  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 She states she has no residual from the stroke. She has no symptoms referable to either breast. She underwent a screening mammogram that showed her to have 2 clusters of calcifications each measuring 2 mm in the lateral aspect of the right breast. These are different than when compared to prior exams. She was recommended a stereotactic biopsy which not could not be completed due to her breast size and location of the calcifications. She was then referred for surgical evaluation. Past Medical History  Diagnosis Date  . Hyperlipidemia   . Hypertension   . Asthma   . Diabetes mellitus without complication   . Stroke     History reviewed. No pertinent past surgical history.  Family History  Problem Relation Age of Onset  . Cancer Mother     colon    Social History History  Substance Use Topics  . Smoking status: Current Every Day Smoker -- 0.25 packs/day  . Smokeless tobacco: Never Used  . Alcohol Use: No    Not on File  Current Outpatient Prescriptions  Medication Sig Dispense Refill  . ADVAIR DISKUS 250-50 MCG/DOSE AEPB       . amLODipine (NORVASC) 10 MG tablet       . atorvastatin (LIPITOR) 20 MG tablet       . enalapril (VASOTEC) 5 MG tablet       . gabapentin (NEURONTIN) 300 MG capsule       . glimepiride (AMARYL) 2 MG tablet       . PROAIR HFA 108 (90 BASE) MCG/ACT inhaler        No current facility-administered medications for this visit.    Review of Systems Review of Systems  Constitutional: Negative for fever, chills and unexpected  weight change.  HENT: Negative for hearing loss, congestion, sore throat, trouble swallowing and voice change.   Eyes: Negative for visual disturbance.  Respiratory: Negative for cough and wheezing.   Cardiovascular: Negative for chest pain, palpitations and leg swelling.  Gastrointestinal: Negative for nausea, vomiting, abdominal pain, diarrhea, constipation, blood in stool, abdominal distention and anal bleeding.  Genitourinary: Negative for hematuria, vaginal bleeding and difficulty urinating.  Musculoskeletal: Negative for arthralgias.  Skin: Negative for rash and wound.  Neurological: Positive for headaches. Negative for seizures and syncope.  Hematological: Negative for adenopathy. Does not bruise/bleed easily.  Psychiatric/Behavioral: Negative for confusion.    Blood pressure 118/72, pulse 64, temperature 97.2 F (36.2 C), temperature source Temporal, resp. rate 14, height 5\' 9"  (1.753 m), weight 128 lb 12.8 oz (58.423 kg).  Physical Exam Physical Exam  Constitutional: She appears well-developed and well-nourished.  Eyes: No scleral icterus.  Neck: Neck supple.  Cardiovascular: Normal rate, regular rhythm and normal heart sounds.   Pulmonary/Chest: Effort normal and breath sounds normal. Right breast exhibits no inverted nipple, no mass, no nipple discharge, no skin change and no tenderness. Left breast exhibits no inverted nipple, no mass, no nipple discharge, no skin change and no tenderness.  Abdominal: Soft. Normal appearance.  Lymphadenopathy:  She has no cervical adenopathy.    She has no axillary adenopathy.       Right: No supraclavicular adenopathy present.       Left: No supraclavicular adenopathy present.    Data Reviewed The patient returns for attempted right breast stereotactic core  biopsy of small clusters of calcifications located within the right  breast laterally. Due to the patient's breast size and location of  calcifications, the calcifications could  not be biopsied  stereotactically. Following discussion with the patient, the  patient has been scheduled for surgical consultation for possible  excisional biopsy with Dr. Dwain Sarna on 07/06/2013.  Addended by: Rolla Plate, M.D. on 06/29/2013 12:42:47.  **END ADDENDUM** SIGNED BY: Rolla Plate, M.D.      Study Result    *RADIOLOGY REPORT*  Clinical Data: Abnormal right screening mammogram  DIGITAL DIAGNOSTIC RIGHT MAMMOGRAM  Comparison: None.  Findings:  ACR Breast Density Category c: The breast tissue is  heterogeneously dense, which may obscure small masses.  Magnification views of the lateral aspect of the right breast were  obtained. There are two clusters of calcifications each measuring  2 mm. The calcifications vary in size and shape there are  developing when compared to the prior exams. There is no  associated mass.  IMPRESSION:  Two clusters of suspicious calcifications in the lateral aspect of  the right breast.  RECOMMENDATION:  I recommend a stereotactic biopsy of the anterior cluster of  calcifications. If these are pathologically benign short-term  interval follow-up of the more posterior calcifications in 6 months  would be suggested.  I have discussed the findings and recommendations with the patient.  Results were also provided in writing at the conclusion of the  visit. If applicable, a reminder letter will be sent to the  patient regarding her next appointment.  BI-RADS CATEGORY 4: Suspicious abnormality - biopsy should be  considered.     Assessment    Right breast calcifications, BiRads 4     Plan    Right breast wire-guided excision  There are new calcifications that are BiRads 4 lesions that are not amenable to stereotactic biopsy due to the patient's breast size and locations of the calcifications. She came in today we discussed either observation or wire-guided excision. I recommended to her excision. We discussed excision to prove  that this is not a cancer. We discussed the wire-guided excision and the risks of bleeding, infection, or further surgery. I'm going to get clearance from her medical doctor as well as figure out exactly what medicines she also on as she does not know today and then we'll get her scheduled.        Nana Vastine 07/06/2013, 2:34 PM

## 2013-07-28 NOTE — Interval H&P Note (Signed)
History and Physical Interval Note:  07/28/2013 12:09 PM  Pamela Hess  has presented today for surgery, with the diagnosis of right breast mm abnormality   The various methods of treatment have been discussed with the patient and family. After consideration of risks, benefits and other options for treatment, the patient has consented to  Procedure(s) with comments: BREAST BIOPSY WITH NEEDLE LOCALIZATION (Right) - needle loc BGC  as a surgical intervention .  The patient's history has been reviewed, patient examined, no change in status, stable for surgery.  I have reviewed the patient's chart and labs.  Questions were answered to the patient's satisfaction.     Jaeger Trueheart

## 2013-07-28 NOTE — Transfer of Care (Signed)
Immediate Anesthesia Transfer of Care Note  Patient: Pamela Hess [facility]  Procedure(s) Performed: Procedure(s) with comments: BREAST BIOPSY WITH NEEDLE LOCALIZATION (Right) - needle loc BGC   Patient Location: PACU  Anesthesia Type:General  Level of Consciousness: sedated  Airway & Oxygen Therapy: Patient Spontanous Breathing and Patient connected to face mask oxygen  Post-op Assessment: Report given to PACU RN and Post -op Vital signs reviewed and stable  Post vital signs: Reviewed and stable  Complications: No apparent anesthesia complications

## 2013-07-28 NOTE — Anesthesia Procedure Notes (Signed)
Procedure Name: LMA Insertion Date/Time: 07/28/2013 12:40 PM Performed by: Zenia Resides D Pre-anesthesia Checklist: Patient identified, Emergency Drugs available, Suction available and Patient being monitored Patient Re-evaluated:Patient Re-evaluated prior to inductionOxygen Delivery Method: Circle System Utilized Preoxygenation: Pre-oxygenation with 100% oxygen Intubation Type: IV induction Ventilation: Mask ventilation without difficulty LMA: LMA inserted LMA Size: 4.0 Number of attempts: 1 Airway Equipment and Method: bite block Placement Confirmation: positive ETCO2 Tube secured with: Tape Dental Injury: Teeth and Oropharynx as per pre-operative assessment

## 2013-07-28 NOTE — Anesthesia Preprocedure Evaluation (Addendum)
Anesthesia Evaluation  Patient identified by MRN, date of birth, ID band Patient awake    Reviewed: Allergy & Precautions, H&P , NPO status , Patient's Chart, lab work & pertinent test results  Airway Mallampati: II  Neck ROM: full    Dental   Pulmonary asthma , COPDCurrent Smoker,          Cardiovascular hypertension, + Peripheral Vascular Disease     Neuro/Psych S/p cerebral aneurysm repair  Neuromuscular disease CVA    GI/Hepatic   Endo/Other  diabetes, Type 2  Renal/GU Renal InsufficiencyRenal disease     Musculoskeletal   Abdominal   Peds  Hematology   Anesthesia Other Findings   Reproductive/Obstetrics                          Anesthesia Physical Anesthesia Plan  ASA: III  Anesthesia Plan: General   Post-op Pain Management:    Induction: Intravenous  Airway Management Planned: LMA  Additional Equipment:   Intra-op Plan:   Post-operative Plan:   Informed Consent: I have reviewed the patients History and Physical, chart, labs and discussed the procedure including the risks, benefits and alternatives for the proposed anesthesia with the patient or authorized representative who has indicated his/her understanding and acceptance.     Plan Discussed with: CRNA, Anesthesiologist and Surgeon  Anesthesia Plan Comments:         Anesthesia Quick Evaluation

## 2013-07-28 NOTE — Op Note (Signed)
Preoperative diagnosis: Right breast mammographic abnormality not amenable to core biopsy Postoperative diagnosis: Same as above Procedure: Right breast wire-guided excisional biopsy Surgeon: Dr. Harden Mo Anesthesia: Gen. Estimated blood loss: Minimal Drains: None Complications: None Specimens: Right breast marked short stitch superior, long stitch lateral, double stitch deep Disposition to recovery in stable condition Sponge and needle count correct at completion  Indications: This is a 67 year old female who underwent her regular screening mammogram with 2 areas in the right breast with microcalcifications that were concerning. These are not amenable to undergoing a core biopsy. She was then referred for excisional biopsy. I saw and evaluated her we discussed indications for excision.  Procedure: After informed consent was obtained the patient was first taken to the breast center where she had a wire placed. I discussed this with Dr. Derinda Late prior to beginning. She then came to day surgery. She was given 2 g of intravenous cefazolin. Sequential compression devices were on her legs. She was placed under general anesthesia without complication. Her right breast was prepped and draped in the standard sterile surgical fashion. A surgical timeout was then performed.  I made a radial incision overlying both of the lesions and the wire. I used cautery then to excise the wire and the surrounding tissue. I had the mammograms in the operating room available for my review while I was doing this. I then obtained a Faxitron mammogram which confirmed removal of the wire in both areas of calcifications. This was confirmed by radiology. I then obtained hemostasis. I then closed with 2-0 Vicryl, 3-0 Vicryl, 4-0 Monocryl. I then infiltrated Marcaine throughout this area. I then placed Dermabond and Steri-Strips. She tolerated this well was extubated and transferred recovery in stable condition.

## 2013-07-30 ENCOUNTER — Telehealth (INDEPENDENT_AMBULATORY_CARE_PROVIDER_SITE_OTHER): Payer: Self-pay | Admitting: *Deleted

## 2013-07-30 ENCOUNTER — Encounter (HOSPITAL_BASED_OUTPATIENT_CLINIC_OR_DEPARTMENT_OTHER): Payer: Self-pay | Admitting: General Surgery

## 2013-07-30 NOTE — Telephone Encounter (Signed)
I called pt to check on her postoperatively. She has no complaints she is just anxiously waiting for her pathology results.  She states she is feeling fine.  I informed pt of her PO appt with Dr. Dwain Sarna on 08/18/13 at 11:00 am.  Pt agreeable.  Pt asks me to check with Dr. Dwain Sarna to see if he can call her soon with her results. Will forward this to Dr. Dwain Sarna and Elease Hashimoto.

## 2013-07-31 NOTE — Telephone Encounter (Signed)
Pamela Hess, Her path is dcis.  I will need to see her next week.  I have not called her.  dont know if i will have time today but may try tomorrow on call. MW

## 2013-08-04 ENCOUNTER — Encounter (INDEPENDENT_AMBULATORY_CARE_PROVIDER_SITE_OTHER): Payer: Medicare Other | Admitting: General Surgery

## 2013-08-04 ENCOUNTER — Encounter (INDEPENDENT_AMBULATORY_CARE_PROVIDER_SITE_OTHER): Payer: Self-pay | Admitting: General Surgery

## 2013-08-04 ENCOUNTER — Ambulatory Visit (INDEPENDENT_AMBULATORY_CARE_PROVIDER_SITE_OTHER): Payer: Medicare Other | Admitting: General Surgery

## 2013-08-04 VITALS — BP 140/80 | HR 76 | Temp 98.1°F | Resp 14 | Ht 69.0 in | Wt 132.8 lb

## 2013-08-04 DIAGNOSIS — D0590 Unspecified type of carcinoma in situ of unspecified breast: Secondary | ICD-10-CM

## 2013-08-04 DIAGNOSIS — D0511 Intraductal carcinoma in situ of right breast: Secondary | ICD-10-CM

## 2013-08-05 NOTE — Progress Notes (Signed)
Subjective:     Patient ID: Pamela Hess, female   DOB: December 27, 1945, 67 y.o.   MRN: [id number]  HPI 72 yof who underwent right breast wire guided excision of calcs that were not amenable to core biopsy.  She has done well and is without complaint.  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 Multiple margins are close or positive as this was wire guided excision for dx not a lumpectomy.  Prognostic panel is pending.  She comes in today to discuss options.   Review of Systems     Objective:   Physical Exam Healing right breast incision without infection    Assessment:     Right breast dcis      Plan:     She will need further surgery.  Due to Cr she cannot get an mr which I would like to get for her.  I will send her for postop mm in the next week to look for residual calcifications.  She has a very small breast size and we will need to decide on lumpectomy vs mastectomy possibly with reconstruction after that.   We discussed the staging and pathophysiology of breast cancer. We discussed all of the different options for treatment for breast cancer including surgery, chemotherapy, radiation therapy, Herceptin, and antiestrogen therapy.   We discussed a sentinel lymph node biopsy as she does not appear to having lymph node involvement right now if we do a mastectomy. We discussed the performance of that with injection of radioactive tracer and blue dye. .  We discussed the options for treatment of the breast cancer which included lumpectomy versus a mastectomy. We discussed the performance of the lumpectomy. We discussed a chance of a positive margin requiring reexcision in the operating room. We also discussed that she may need radiation therapy or antiestrogen therapy or both if she undergoes lumpectomy. We discussed the mastectomy and the postoperative care for that as well. We discussed that there is no difference in  her survival whether she undergoes lumpectomy with radiation therapy or antiestrogen therapy versus a mastectomy.  We discussed the risks of operation including bleeding, infection, possible reoperation. She understands her further therapy will be based on what her stages at the time of her operation.

## 2013-08-17 ENCOUNTER — Ambulatory Visit
Admission: RE | Admit: 2013-08-17 | Discharge: 2013-08-17 | Disposition: A | Discharge status: Home / Self Care | Payer: Medicare Other | Source: Ambulatory Visit | Attending: General Surgery | Admitting: General Surgery

## 2013-08-17 DIAGNOSIS — D0511 Intraductal carcinoma in situ of right breast: Secondary | ICD-10-CM

## 2013-08-18 ENCOUNTER — Encounter (INDEPENDENT_AMBULATORY_CARE_PROVIDER_SITE_OTHER): Payer: Self-pay | Admitting: General Surgery

## 2013-08-18 ENCOUNTER — Encounter (INDEPENDENT_AMBULATORY_CARE_PROVIDER_SITE_OTHER): Payer: Medicare Other | Admitting: General Surgery

## 2013-08-18 ENCOUNTER — Encounter (HOSPITAL_COMMUNITY): Payer: Self-pay | Admitting: Pharmacist

## 2013-08-18 ENCOUNTER — Ambulatory Visit (INDEPENDENT_AMBULATORY_CARE_PROVIDER_SITE_OTHER): Payer: Medicare Other | Admitting: General Surgery

## 2013-08-18 VITALS — BP 158/82 | HR 84 | Resp 18 | Ht 69.0 in | Wt 130.0 lb

## 2013-08-18 DIAGNOSIS — D0511 Intraductal carcinoma in situ of right breast: Secondary | ICD-10-CM

## 2013-08-18 DIAGNOSIS — D0590 Unspecified type of carcinoma in situ of unspecified breast: Secondary | ICD-10-CM

## 2013-08-18 NOTE — Progress Notes (Signed)
Subjective:     Patient ID: Pamela Hess, female   DOB: 24-Aug-1946, 67 y.o.   MRN: [id number]  HPI 57 yof who underwent right breast wire guided excision of calcs that were not amenable to core biopsy. She has done well and is without complaint. 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 Multiple margins are close or positive as this was wire guided excision for diagnosis not a lumpectomy. I still dont see a prognostic panel.  She cannot get mr due to cr.  I sent her to get repeat mm to see about residual calcifications and this is below. She comes back in today to discuss options with her daughter present.    Review of Systems EXAM:  DIGITAL DIAGNOSTIC UNILATERAL RIGHT MAMMOGRAM  COMPARISON: With priors  ACR Breast Density Category b: There are scattered areas of  fibroglandular density.  FINDINGS:  Far laterally in the right breast are several are punctate  calcifications. Most of these are round but have a similar  appearance to the calcifications that were DCIS. When the patient  undergoes re-excision excision needle localization of the residual  calcifications is recommended. No additional abnormalities are seen  in the right breast. There is no mass.  Mammographic images were processed with CAD.  IMPRESSION:  Several punctate calcifications far laterally in the right breast  that have a similar appearance to the patient's DCIS that was  resected. Needle localization and excision is recommended.  RECOMMENDATION:  Surgical excision of right breast calcifications.  I have discussed the findings and recommendations with the patient.  Results were also provided in writing at the conclusion of the  visit. If applicable, a reminder letter will be sent to the patient  regarding the next appointment.     Objective:   Physical Exam Deferred today    Assessment:     Right breast dcis     Plan:     Right  mastectomy (including nipple and areola), right axillary sentinel node biopsy  With a long conversation today about further surgery. I told her that I didn't think we could attempt to do a lumpectomy. I am somewhat concerned about her cosmetic result but will think we can put it back together fairly well and I would need to remove a fair amount more tissue. There are some additional calcifications for laterally in the right breast. I don't think these weren't necessarily the area that I excised previously. I told her that there was a positive margin rate ranging from 10-15% with this requiring reoperation. We discussed the other option being a mastectomy. We discussed a nipple sparing mastectomy. We also discussed the possibility of an immediate reconstruction with plastic surgery which she declined. We discussed the need for radiation therapy with lumpectomy. After this conversation she would like to proceed with a mastectomy including the nipple and areolar complex without any reconstruction. We discussed the staging and pathophysiology of breast cancer. We discussed all of the different options for treatment for breast cancer including surgery, chemotherapy, radiation therapy, Herceptin, and antiestrogen therapy. She likely won't need anything except surgery and possibly antiestrogen therapy.  We discussed a sentinel lymph node biopsy as we are doing mastectomy for dcis. We discussed the performance of that with injection of radioactive tracer and possibly blue dye.  We discussed about a 1-2% risk lifetime of chronic shoulder pain as well as lymphedema associated with a sentinel lymph node biopsy.  We  discussed the options for treatment of the breast cancer which included lumpectomy versus a mastectomy. We discussed the performance of the lumpectomy with a wire placement. We discussed a 10-20% chance of a positive margin requiring reexcision in the operating room. We also discussed that she may need  radiation therapy or antiestrogen therapy or both if she undergoes lumpectomy. We discussed the mastectomy and the postoperative care for that as well. We discussed that there is no difference in her survival whether she undergoes lumpectomy with radiation therapy or antiestrogen therapy versus a mastectomy.  We discussed the risks of operation including bleeding, infection, possible reoperation. She understands her further therapy will be based on what her stages at the time of her operation.

## 2013-08-19 NOTE — Pre-Procedure Instructions (Addendum)
[facility]  08/19/2013   Your procedure is scheduled on:  Friday, November 14th.  Report to [facility], Main Entrance [facility] "A" at 5:30 AM.  Call this number if you have problems the morning of surgery: [phone]   Remember:   Do not eat food or drink liquids after midnight.   Take these medicines the morning of surgery with A SIP OF WATER: amLODipine (NORVASC), gabapentin (NEURONTIN). May use inhalers, bring PROAIR HFA 108 (90 BASE) inhaler to the hospital with you.  Stop taking Aspirin, Coumadin, Plavix, Effient and Herbal medications.  Do not take anyS NSAIDs ie: Ibuprofen,  Advil,Naproxen or any medication containing Aspirin.   Do not wear jewelry, make-up or nail polish.  Do not wear lotions, powders, or perfumes. You may wear deodorant.  Do not shave 48 hours prior to surgery.   Do not bring valuables to the hospital.  [facility] is not responsible                  for any belongings or valuables.               Contacts, dentures or bridgework may not be worn into surgery.  Leave suitcase in the car. After surgery it may be brought to your room.  For patients admitted to the hospital, discharge time is determined by your                treatment team.                 Special Instructions: Shower with CHG wash (Bactoshield) tonight and again in the am prior to arriving to hospital.   Please read over the following fact sheets that you were given: Pain Booklet, Coughing and Deep Breathing and Surgical Site Infection Prevention

## 2013-08-20 ENCOUNTER — Encounter (HOSPITAL_COMMUNITY): Payer: Self-pay

## 2013-08-20 ENCOUNTER — Encounter (HOSPITAL_COMMUNITY)
Admission: RE | Admit: 2013-08-20 | Discharge: 2013-08-20 | Disposition: A | Discharge status: Home / Self Care | Payer: Medicare Other | Source: Ambulatory Visit | Attending: General Surgery | Admitting: General Surgery

## 2013-08-20 ENCOUNTER — Encounter (HOSPITAL_COMMUNITY)
Admission: RE | Admit: 2013-08-20 | Discharge: 2013-08-20 | Disposition: A | Discharge status: Home / Self Care | Payer: Medicare Other | Source: Ambulatory Visit | Attending: Anesthesiology | Admitting: Anesthesiology

## 2013-08-20 LAB — CBC
HCT: 39.3 % (ref 36.0–46.0)
MCHC: 33.8 g/dL (ref 30.0–36.0)
MCV: 94.7 fL (ref 78.0–100.0)
Platelets: 295 10*3/uL (ref 150–400)
RDW: 14 % (ref 11.5–15.5)
WBC: 8.1 10*3/uL (ref 4.0–10.5)

## 2013-08-20 LAB — BASIC METABOLIC PANEL
BUN: 14 mg/dL (ref 6–23)
Calcium: 9.2 mg/dL (ref 8.4–10.5)
Creatinine, Ser: 1.23 mg/dL — ABNORMAL HIGH (ref 0.50–1.10)
GFR calc Af Amer: 52 mL/min — ABNORMAL LOW (ref >90)
GFR calc non Af Amer: 45 mL/min — ABNORMAL LOW (ref >90)
Potassium: 3.7 mEq/L (ref 3.5–5.1)

## 2013-08-20 MED ORDER — CEFAZOLIN SODIUM-DEXTROSE 2-3 GM-% IV SOLR
2.0000 g | INTRAVENOUS | Status: AC
  Administered 2013-08-21: 2 g via INTRAVENOUS

## 2013-08-20 NOTE — Progress Notes (Signed)
PCP: Dr. Evelina Bucy at [facility] on [address]. Requested ekg.  Verified w/ nuc. ned. For injection tomorrow at 0700, spoke w/ [facility].

## 2013-08-21 ENCOUNTER — Encounter (HOSPITAL_COMMUNITY)
Admission: RE | Admit: 2013-08-21 | Discharge: 2013-08-21 | Disposition: A | Discharge status: Home / Self Care | Payer: Medicare Other | Source: Ambulatory Visit | Attending: General Surgery | Admitting: General Surgery

## 2013-08-21 ENCOUNTER — Other Ambulatory Visit (INDEPENDENT_AMBULATORY_CARE_PROVIDER_SITE_OTHER): Payer: Self-pay | Admitting: General Surgery

## 2013-08-21 ENCOUNTER — Encounter (HOSPITAL_COMMUNITY): Payer: Medicare Other | Admitting: Anesthesiology

## 2013-08-21 ENCOUNTER — Encounter (HOSPITAL_COMMUNITY)
Admission: RE | Disposition: A | Discharge status: Home / Self Care | Payer: Self-pay | Source: Ambulatory Visit | Attending: General Surgery

## 2013-08-21 ENCOUNTER — Encounter: Payer: Self-pay | Admitting: *Deleted

## 2013-08-21 ENCOUNTER — Ambulatory Visit (HOSPITAL_COMMUNITY): Payer: Medicare Other | Admitting: Anesthesiology

## 2013-08-21 ENCOUNTER — Encounter (HOSPITAL_COMMUNITY): Payer: Self-pay | Admitting: *Deleted

## 2013-08-21 ENCOUNTER — Observation Stay (HOSPITAL_COMMUNITY)
Admission: RE | Admit: 2013-08-21 | Discharge: 2013-08-23 | Disposition: A | Discharge status: Home / Self Care | Payer: Medicare Other | Source: Ambulatory Visit | Attending: General Surgery | Admitting: General Surgery

## 2013-08-21 DIAGNOSIS — D0510 Intraductal carcinoma in situ of unspecified breast: Secondary | ICD-10-CM | POA: Diagnosis present

## 2013-08-21 DIAGNOSIS — D0511 Intraductal carcinoma in situ of right breast: Secondary | ICD-10-CM

## 2013-08-21 DIAGNOSIS — J449 Chronic obstructive pulmonary disease, unspecified: Secondary | ICD-10-CM | POA: Insufficient documentation

## 2013-08-21 DIAGNOSIS — Z01818 Encounter for other preprocedural examination: Secondary | ICD-10-CM | POA: Insufficient documentation

## 2013-08-21 DIAGNOSIS — Z8673 Personal history of transient ischemic attack (TIA), and cerebral infarction without residual deficits: Secondary | ICD-10-CM | POA: Insufficient documentation

## 2013-08-21 DIAGNOSIS — D0590 Unspecified type of carcinoma in situ of unspecified breast: Principal | ICD-10-CM | POA: Insufficient documentation

## 2013-08-21 DIAGNOSIS — Z01812 Encounter for preprocedural laboratory examination: Secondary | ICD-10-CM | POA: Insufficient documentation

## 2013-08-21 DIAGNOSIS — I10 Essential (primary) hypertension: Secondary | ICD-10-CM | POA: Insufficient documentation

## 2013-08-21 DIAGNOSIS — J4489 Other specified chronic obstructive pulmonary disease: Secondary | ICD-10-CM | POA: Insufficient documentation

## 2013-08-21 HISTORY — PX: MASTECTOMY W/ SENTINEL NODE BIOPSY: SHX2001

## 2013-08-21 HISTORY — DX: Malignant (primary) neoplasm, unspecified: C80.1

## 2013-08-21 LAB — GLUCOSE, CAPILLARY
Glucose-Capillary: 70 mg/dL (ref 70–99)
Glucose-Capillary: 105 mg/dL — ABNORMAL HIGH (ref 70–99)
Glucose-Capillary: 89 mg/dL (ref 70–99)
Glucose-Capillary: 115 mg/dL — ABNORMAL HIGH (ref 70–99)

## 2013-08-21 SURGERY — MASTECTOMY WITH SENTINEL LYMPH NODE BIOPSY
Anesthesia: General | Site: Breast | Laterality: Right | Wound class: Clean

## 2013-08-21 MED ORDER — PROMETHAZINE HCL 25 MG/ML IJ SOLN: 6.2500 mg | INTRAMUSCULAR | Status: DC | PRN

## 2013-08-21 MED ORDER — METHYLENE BLUE 1 % INJ SOLN
INTRAMUSCULAR | Status: AC
  Filled 2013-08-21: qty 10

## 2013-08-21 MED ORDER — ALBUTEROL SULFATE HFA 108 (90 BASE) MCG/ACT IN AERS: 1.0000 | INHALATION_SPRAY | Freq: Four times a day (QID) | RESPIRATORY_TRACT | Status: DC | PRN

## 2013-08-21 MED ORDER — LIDOCAINE HCL (CARDIAC) 20 MG/ML IV SOLN
INTRAVENOUS | Status: DC | PRN
  Administered 2013-08-21: 100 mg via INTRAVENOUS

## 2013-08-21 MED ORDER — GLIMEPIRIDE 2 MG PO TABS
2.0000 mg | ORAL_TABLET | Freq: Every day | ORAL | Status: DC
  Administered 2013-08-22 – 2013-08-23 (×2): 2 mg via ORAL
  Filled 2013-08-21 (×3): qty 1

## 2013-08-21 MED ORDER — 0.9 % SODIUM CHLORIDE (POUR BTL) OPTIME
TOPICAL | Status: DC | PRN
  Administered 2013-08-21: 1000 mL

## 2013-08-21 MED ORDER — FENTANYL CITRATE 0.05 MG/ML IJ SOLN
INTRAMUSCULAR | Status: DC | PRN
  Administered 2013-08-21: 100 ug via INTRAVENOUS

## 2013-08-21 MED ORDER — OXYCODONE HCL 5 MG/5ML PO SOLN: 5.0000 mg | Freq: Once | ORAL | Status: DC | PRN

## 2013-08-21 MED ORDER — ARTIFICIAL TEARS OP OINT
TOPICAL_OINTMENT | OPHTHALMIC | Status: DC | PRN
  Administered 2013-08-21: 1 via OPHTHALMIC

## 2013-08-21 MED ORDER — ONDANSETRON HCL 4 MG/2ML IJ SOLN: 4.0000 mg | Freq: Four times a day (QID) | INTRAMUSCULAR | Status: DC | PRN

## 2013-08-21 MED ORDER — PROPOFOL 10 MG/ML IV BOLUS
INTRAVENOUS | Status: DC | PRN
  Administered 2013-08-21: 130 mg via INTRAVENOUS

## 2013-08-21 MED ORDER — MIDAZOLAM HCL 5 MG/5ML IJ SOLN
INTRAMUSCULAR | Status: DC | PRN
  Administered 2013-08-21: 2 mg via INTRAVENOUS

## 2013-08-21 MED ORDER — MORPHINE SULFATE 2 MG/ML IJ SOLN: 2.0000 mg | INTRAMUSCULAR | Status: DC | PRN

## 2013-08-21 MED ORDER — INSULIN ASPART 100 UNIT/ML ~~LOC~~ SOLN: 0.0000 [IU] | Freq: Three times a day (TID) | SUBCUTANEOUS | Status: DC

## 2013-08-21 MED ORDER — MOMETASONE FURO-FORMOTEROL FUM 100-5 MCG/ACT IN AERO
2.0000 | INHALATION_SPRAY | Freq: Two times a day (BID) | RESPIRATORY_TRACT | Status: DC
  Administered 2013-08-21 – 2013-08-22 (×3): 2 via RESPIRATORY_TRACT
  Filled 2013-08-21: qty 8.8

## 2013-08-21 MED ORDER — OXYCODONE HCL 5 MG PO TABS: 5.0000 mg | ORAL_TABLET | Freq: Once | ORAL | Status: DC | PRN

## 2013-08-21 MED ORDER — ONDANSETRON HCL 4 MG/2ML IJ SOLN
INTRAMUSCULAR | Status: DC | PRN
  Administered 2013-08-21: 4 mg via INTRAVENOUS

## 2013-08-21 MED ORDER — OXYCODONE HCL 5 MG PO TABS
5.0000 mg | ORAL_TABLET | ORAL | Status: DC | PRN
  Administered 2013-08-21 – 2013-08-23 (×5): 5 mg via ORAL
  Filled 2013-08-21 (×5): qty 1

## 2013-08-21 MED ORDER — SODIUM CHLORIDE 0.9 % IV SOLN
INTRAVENOUS | Status: DC
  Administered 2013-08-21 – 2013-08-22 (×2): via INTRAVENOUS

## 2013-08-21 MED ORDER — ENALAPRIL MALEATE 5 MG PO TABS
5.0000 mg | ORAL_TABLET | Freq: Every day | ORAL | Status: DC
  Administered 2013-08-22: 5 mg via ORAL
  Filled 2013-08-21 (×2): qty 1

## 2013-08-21 MED ORDER — TECHNETIUM TC 99M SULFUR COLLOID FILTERED
1.0000 | Freq: Once | INTRAVENOUS | Status: AC | PRN
  Administered 2013-08-21: 1 via INTRADERMAL

## 2013-08-21 MED ORDER — LACTATED RINGERS IV SOLN
INTRAVENOUS | Status: DC | PRN
  Administered 2013-08-21: 07:00:00 via INTRAVENOUS

## 2013-08-21 MED ORDER — HYDROMORPHONE HCL PF 1 MG/ML IJ SOLN
0.2500 mg | INTRAMUSCULAR | Status: DC | PRN
  Administered 2013-08-21: 0.25 mg via INTRAVENOUS

## 2013-08-21 MED ORDER — NORTRIPTYLINE HCL 25 MG PO CAPS
25.0000 mg | ORAL_CAPSULE | Freq: Every day | ORAL | Status: DC
  Administered 2013-08-22: 25 mg via ORAL
  Filled 2013-08-21 (×4): qty 1

## 2013-08-21 MED ORDER — HYDROMORPHONE HCL PF 1 MG/ML IJ SOLN
INTRAMUSCULAR | Status: AC
  Filled 2013-08-21: qty 1

## 2013-08-21 MED ORDER — ACETAMINOPHEN 650 MG RE SUPP: 650.0000 mg | Freq: Four times a day (QID) | RECTAL | Status: DC | PRN

## 2013-08-21 MED ORDER — CEFAZOLIN SODIUM-DEXTROSE 2-3 GM-% IV SOLR
2.0000 g | Freq: Three times a day (TID) | INTRAVENOUS | Status: AC
  Administered 2013-08-21 – 2013-08-22 (×3): 2 g via INTRAVENOUS
  Filled 2013-08-21 (×3): qty 50

## 2013-08-21 MED ORDER — GABAPENTIN 300 MG PO CAPS
300.0000 mg | ORAL_CAPSULE | Freq: Three times a day (TID) | ORAL | Status: DC
  Administered 2013-08-21 – 2013-08-22 (×5): 300 mg via ORAL
  Filled 2013-08-21 (×8): qty 1

## 2013-08-21 MED ORDER — AMLODIPINE BESYLATE 10 MG PO TABS
10.0000 mg | ORAL_TABLET | Freq: Every day | ORAL | Status: DC
  Administered 2013-08-22: 10 mg via ORAL
  Filled 2013-08-21 (×2): qty 1

## 2013-08-21 MED ORDER — ACETAMINOPHEN 325 MG PO TABS: 650.0000 mg | ORAL_TABLET | Freq: Four times a day (QID) | ORAL | Status: DC | PRN

## 2013-08-21 MED ORDER — BUPIVACAINE-EPINEPHRINE PF 0.25-1:200000 % IJ SOLN
INTRAMUSCULAR | Status: AC
  Filled 2013-08-21: qty 30

## 2013-08-21 SURGICAL SUPPLY — 73 items
ADH SKN CLS APL DERMABOND .7 (GAUZE/BANDAGES/DRESSINGS) ×2
APL SKNCLS STERI-STRIP NONHPOA (GAUZE/BANDAGES/DRESSINGS)
APPLIER CLIP 9.375 MED OPEN (MISCELLANEOUS)
APR CLP MED 9.3 20 MLT OPN (MISCELLANEOUS)
BENZOIN TINCTURE PRP APPL 2/3 (GAUZE/BANDAGES/DRESSINGS) IMPLANT
BINDER BREAST LRG (GAUZE/BANDAGES/DRESSINGS) ×2 IMPLANT
BINDER BREAST XLRG (GAUZE/BANDAGES/DRESSINGS) IMPLANT
BIOPATCH RED 1 DISK 7.0 (GAUZE/BANDAGES/DRESSINGS) ×2 IMPLANT
BLADE SURG 10 STRL SS (BLADE) ×3 IMPLANT
BLADE SURG 15 STRL LF DISP TIS (BLADE) ×2 IMPLANT
BLADE SURG 15 STRL SS (BLADE) ×3
CANISTER SUCTION 2500CC (MISCELLANEOUS) ×3 IMPLANT
CHLORAPREP W/TINT 26ML (MISCELLANEOUS) ×3 IMPLANT
CLIP APPLIE 9.375 MED OPEN (MISCELLANEOUS) ×1 IMPLANT
CLSR STERI-STRIP ANTIMIC 1/2X4 (GAUZE/BANDAGES/DRESSINGS) ×2 IMPLANT
CONT SPEC 4OZ CLIKSEAL STRL BL (MISCELLANEOUS) ×4 IMPLANT
CONT SPEC STER OR (MISCELLANEOUS) ×1 IMPLANT
COVER PROBE W GEL 5X96 (DRAPES) ×3 IMPLANT
COVER SURGICAL LIGHT HANDLE (MISCELLANEOUS) ×3 IMPLANT
DECANTER SPIKE VIAL GLASS SM (MISCELLANEOUS) ×1 IMPLANT
DERMABOND ADVANCED (GAUZE/BANDAGES/DRESSINGS) ×1
DERMABOND ADVANCED .7 DNX12 (GAUZE/BANDAGES/DRESSINGS) ×1 IMPLANT
DEVICE DUBIN SPECIMEN MAMMOGRA (MISCELLANEOUS) ×1 IMPLANT
DRAIN CHANNEL 19F RND (DRAIN) ×2 IMPLANT
DRAPE CHEST BREAST 15X10 FENES (DRAPES) ×3 IMPLANT
DRSG PAD ABDOMINAL 8X10 ST (GAUZE/BANDAGES/DRESSINGS) ×2 IMPLANT
ELECT BLADE 4.0 EZ CLEAN MEGAD (MISCELLANEOUS) ×3
ELECT CAUTERY BLADE 6.4 (BLADE) ×3 IMPLANT
ELECT REM PT RETURN 9FT ADLT (ELECTROSURGICAL) ×3
ELECTRODE BLDE 4.0 EZ CLN MEGD (MISCELLANEOUS) ×1 IMPLANT
ELECTRODE REM PT RTRN 9FT ADLT (ELECTROSURGICAL) ×2 IMPLANT
EVACUATOR SILICONE 100CC (DRAIN) ×2 IMPLANT
GLOVE BIO SURGEON STRL SZ7 (GLOVE) ×3 IMPLANT
GLOVE BIO SURGEON STRL SZ7.5 (GLOVE) ×2 IMPLANT
GLOVE BIOGEL PI IND STRL 7.5 (GLOVE) ×2 IMPLANT
GLOVE BIOGEL PI INDICATOR 7.5 (GLOVE) ×1
GLOVE BIOGEL PI ORTHO PRO SZ7 (GLOVE) ×1
GLOVE PI ORTHO PRO STRL SZ7 (GLOVE) ×1 IMPLANT
GLOVE SURG SS PI 7.0 STRL IVOR (GLOVE) ×2 IMPLANT
GOWN SRG XL XLNG 56XLVL 4 (GOWN DISPOSABLE) ×1 IMPLANT
GOWN STRL NON-REIN LRG LVL3 (GOWN DISPOSABLE) ×4 IMPLANT
GOWN STRL NON-REIN XL XLG LVL4 (GOWN DISPOSABLE) ×3
KIT BASIN OR (CUSTOM PROCEDURE TRAY) ×3 IMPLANT
KIT MARKER MARGIN INK (KITS) IMPLANT
KIT ROOM TURNOVER OR (KITS) ×3 IMPLANT
NDL 18GX1X1/2 (RX/OR ONLY) (NEEDLE) IMPLANT
NDL HYPO 25GX1X1/2 BEV (NEEDLE) ×1 IMPLANT
NEEDLE 18GX1X1/2 (RX/OR ONLY) (NEEDLE) ×3 IMPLANT
NEEDLE HYPO 25GX1X1/2 BEV (NEEDLE) ×3 IMPLANT
NS IRRIG 1000ML POUR BTL (IV SOLUTION) ×3 IMPLANT
PACK SURGICAL SETUP 50X90 (CUSTOM PROCEDURE TRAY) ×3 IMPLANT
PAD ARMBOARD 7.5X6 YLW CONV (MISCELLANEOUS) ×3 IMPLANT
PENCIL BUTTON HOLSTER BLD 10FT (ELECTRODE) ×3 IMPLANT
SPONGE GAUZE 4X4 12PLY (GAUZE/BANDAGES/DRESSINGS) IMPLANT
SPONGE LAP 18X18 X RAY DECT (DISPOSABLE) ×5 IMPLANT
STAPLER VISISTAT 35W (STAPLE) ×3 IMPLANT
STOCKINETTE IMPERVIOUS 9X36 MD (GAUZE/BANDAGES/DRESSINGS) IMPLANT
STRIP CLOSURE SKIN 1/2X4 (GAUZE/BANDAGES/DRESSINGS) IMPLANT
SUT ETHILON 2 0 FS 18 (SUTURE) ×2 IMPLANT
SUT MNCRL AB 4-0 PS2 18 (SUTURE) ×4 IMPLANT
SUT SILK 2 0 SH (SUTURE) ×2 IMPLANT
SUT VIC AB 2-0 SH 27 (SUTURE) ×3
SUT VIC AB 2-0 SH 27XBRD (SUTURE) ×3 IMPLANT
SUT VIC AB 3-0 SH 27 (SUTURE) ×3
SUT VIC AB 3-0 SH 27XBRD (SUTURE) ×3 IMPLANT
SUT VIC AB 3-0 SH 8-18 (SUTURE) ×2 IMPLANT
SYR BULB IRRIGATION 50ML (SYRINGE) ×2 IMPLANT
SYR CONTROL 10ML LL (SYRINGE) ×5 IMPLANT
TOWEL OR 17X24 6PK STRL BLUE (TOWEL DISPOSABLE) ×3 IMPLANT
TOWEL OR 17X26 10 PK STRL BLUE (TOWEL DISPOSABLE) ×3 IMPLANT
TUBE CONNECTING 12X1/4 (SUCTIONS) ×3 IMPLANT
WATER STERILE IRR 1000ML POUR (IV SOLUTION) ×2 IMPLANT
YANKAUER SUCT BULB TIP NO VENT (SUCTIONS) ×5 IMPLANT

## 2013-08-21 NOTE — Preoperative (Signed)
Beta Blockers   Reason not to administer Beta Blockers:Not Applicable 

## 2013-08-21 NOTE — Progress Notes (Signed)
Went to take pt. Pain medication.  Checked her JP site and saw that it was leaking and put drainage foam pads around site.  Paged MD.  Awaiting orders. Pamela Hess

## 2013-08-21 NOTE — Progress Notes (Signed)
Received referral in [place].  Pt is having surgery today (11/14), so I emailed Dr. Dwain Sarna to inform him that I would contact the pt on Monday to schedule this appt.  Emailed the Med Onc's to request for a date and time for this pt.

## 2013-08-21 NOTE — Interval H&P Note (Signed)
History and Physical Interval Note:  08/21/2013 7:15 AM  Pamela Hess  has presented today for surgery, with the diagnosis of right breast DCIS  The various methods of treatment have been discussed with the patient and family. After consideration of risks, benefits and other options for treatment, the patient has consented to  Procedure(s): PARTIAL MASTECTOMY WITH AXILLARY SENTINEL LYMPH NODE BIOPSY (Right) as a surgical intervention .  The patient's history has been reviewed, patient examined, no change in status, stable for surgery.  I have reviewed the patient's chart and labs.  Questions were answered to the patient's satisfaction.     Darlean Warmoth

## 2013-08-21 NOTE — H&P (View-Only) (Signed)
Subjective:     Patient ID: Pamela Hess, female   DOB: 09/10/1946, 67 y.o.   MRN: [id number]  HPI 67 yof who underwent right breast wire guided excision of calcs that were not amenable to core biopsy.  She has done well and is without complaint.  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 Multiple margins are close or positive as this was wire guided excision for dx not a lumpectomy.  Prognostic panel is pending.  She comes in today to discuss options.   Review of Systems     Objective:   Physical Exam Healing right breast incision without infection    Assessment:     Right breast dcis      Plan:     She will need further surgery.  Due to Cr she cannot get an mr which I would like to get for her.  I will send her for postop mm in the next week to look for residual calcifications.  She has a very small breast size and we will need to decide on lumpectomy vs mastectomy possibly with reconstruction after that.   We discussed the staging and pathophysiology of breast cancer. We discussed all of the different options for treatment for breast cancer including surgery, chemotherapy, radiation therapy, Herceptin, and antiestrogen therapy.   We discussed a sentinel lymph node biopsy as she does not appear to having lymph node involvement right now if we do a mastectomy. We discussed the performance of that with injection of radioactive tracer and blue dye. .  We discussed the options for treatment of the breast cancer which included lumpectomy versus a mastectomy. We discussed the performance of the lumpectomy. We discussed a chance of a positive margin requiring reexcision in the operating room. We also discussed that she may need radiation therapy or antiestrogen therapy or both if she undergoes lumpectomy. We discussed the mastectomy and the postoperative care for that as well. We discussed that there is no difference in  her survival whether she undergoes lumpectomy with radiation therapy or antiestrogen therapy versus a mastectomy.  We discussed the risks of operation including bleeding, infection, possible reoperation. She understands her further therapy will be based on what her stages at the time of her operation.           

## 2013-08-21 NOTE — Anesthesia Preprocedure Evaluation (Signed)
Anesthesia Evaluation  Patient identified by MRN, date of birth, ID band Patient awake    Reviewed: Allergy & Precautions, H&P , NPO status , Patient's Chart, lab work & pertinent test results  Airway Mallampati: I TM Distance: >3 FB Neck ROM: Full    Dental  (+) Edentulous Upper, Edentulous Lower and Dental Advisory Given   Pulmonary COPD COPD inhaler, Current Smoker,    Pulmonary exam normal       Cardiovascular hypertension, + Peripheral Vascular Disease     Neuro/Psych CVA, No Residual Symptoms    GI/Hepatic negative GI ROS, Neg liver ROS,   Endo/Other  diabetes, Oral Hypoglycemic Agents  Renal/GU Renal InsufficiencyRenal disease     Musculoskeletal   Abdominal   Peds  Hematology   Anesthesia Other Findings   Reproductive/Obstetrics                           Anesthesia Physical Anesthesia Plan  ASA: III  Anesthesia Plan: General   Post-op Pain Management:    Induction: Intravenous  Airway Management Planned: LMA  Additional Equipment:   Intra-op Plan:   Post-operative Plan: Extubation in OR  Informed Consent: I have reviewed the patients History and Physical, chart, labs and discussed the procedure including the risks, benefits and alternatives for the proposed anesthesia with the patient or authorized representative who has indicated his/her understanding and acceptance.   Dental advisory given  Plan Discussed with: CRNA, Anesthesiologist and Surgeon  Anesthesia Plan Comments:         Anesthesia Quick Evaluation

## 2013-08-21 NOTE — Op Note (Signed)
Preoperative diagnosis: Right breast ductal carcinoma in situ Postoperative diagnosis: Same as above Procedure: #1 right total mastectomy #2 right axillary sentinel lymph node biopsy Surgeon: Dr. Harden Mo Anesthesia: Gen. Estimated blood loss: Minimal Drains: 19 [place] Blake drain Specimens: #1 right breast marked short stitch superior, long stitch lateral #2 right axillary sentinel node #1 #2 right axillary sentinel node #2 with a count of 509 Complications: None Sponge and needle count was correct at completion Disposition to recovery stable  Indications: This 67 year old female who presents after having an abnormal mammogram on the right side. This was unable to undergo radiologic biopsy so we did an excisional biopsy which showed ductal carcinoma in situ that was present at multiple margins. I saw her back in the office. She had another mammogram with some residual calcifications in the lateral portion of the breast.  We had a long discussion about her different options and she chose to undergo a simple mastectomy without reconstruction we discussed a sentinel node biopsy at the same time.  Procedure: After informed consent was obtained the patient was taken to the operating room. She was first given technetium in the standard periareolar fashion. She was given 2 g of cefazolin. She had sequential compression devices on her legs. She was placed under general anesthesia without complication. Her right breast and axilla were then prepped and draped in the standard sterile surgical fashion. A surgical timeout was then performed.  I made an elliptical incision encompassing the nipple areolar complex as well as her old incision. Flaps were created to the clavicle superiorly, inframammary crease inferiorly, sternum medially, and latissimus laterally. The breast was then removed from the pectoralis muscle including the pectoralis fascia. This was then marked as above and passed off the table  as specimen. I then used the neoprobe to identify one hot sentinel lymph node. There was an additional node right next to this I removed also. There was no background radioactivity. Hemostasis was obtained. Irrigation was performed. I then placed a 50 [place] Blake drain and secured this with a 2-0 nylon suture. I then closed this with 3-0 Vicryl, 4-0 Monocryl, and Dermabond, and Steri-Strips. A Biopatch was placed over the drain. I then placed a dressing and a breast binder. She tolerated this well was extubated and transferred to recovery stable.

## 2013-08-21 NOTE — Anesthesia Postprocedure Evaluation (Signed)
Anesthesia Post Note  Patient: Pamela Hess [facility]  Procedure(s) Performed: Procedure(s) (LRB): MASTECTOMY WITH SENTINEL LYMPH NODE BIOPSY (Right)  Anesthesia type: general  Patient location: PACU  Post pain: Pain level controlled  Post assessment: Patient's Cardiovascular Status Stable  Last Vitals:  Filed Vitals:   08/21/13 0922  BP: 154/93  Pulse: 53  Temp:   Resp: 17    Post vital signs: Reviewed and stable  Level of consciousness: sedated  Complications: No apparent anesthesia complications

## 2013-08-21 NOTE — Transfer of Care (Signed)
Immediate Anesthesia Transfer of Care Note  Patient: Pamela Hess [facility]  Procedure(s) Performed: Procedure(s): MASTECTOMY WITH SENTINEL LYMPH NODE BIOPSY (Right)  Patient Location: PACU  Anesthesia Type:General  Level of Consciousness: awake, alert , oriented and sedated  Airway & Oxygen Therapy: Patient Spontanous Breathing and Patient connected to nasal cannula oxygen  Post-op Assessment: Report given to PACU RN, Post -op Vital signs reviewed and stable and Patient moving all extremities  Post vital signs: Reviewed and stable  Complications: No apparent anesthesia complications

## 2013-08-22 LAB — GLUCOSE, CAPILLARY
Glucose-Capillary: 112 mg/dL — ABNORMAL HIGH (ref 70–99)
Glucose-Capillary: 87 mg/dL (ref 70–99)
Glucose-Capillary: 70 mg/dL (ref 70–99)

## 2013-08-22 LAB — CBC
HCT: 31.2 % — ABNORMAL LOW (ref 36.0–46.0)
MCHC: 33.3 g/dL (ref 30.0–36.0)
Platelets: 229 10*3/uL (ref 150–400)
RBC: 3.32 MIL/uL — ABNORMAL LOW (ref 3.87–5.11)
RDW: 13.6 % (ref 11.5–15.5)
WBC: 8.1 10*3/uL (ref 4.0–10.5)

## 2013-08-22 NOTE — Progress Notes (Signed)
1 Day Post-Op   Assessment: s/p Procedure(s): MASTECTOMY WITH SENTINEL LYMPH NODE BIOPSY Patient Active Problem List   Diagnosis Date Noted  . 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 GOUT 03/24/2007  . ALCOHOL ABUSE 03/24/2007  . PERIPHERAL NEUROPATHY 03/24/2007  . HYPERTENSION, ESSENTIAL NOS 03/24/2007  . CEREBROVASCULAR DISEASE LATE EFFECTS, NOS 03/24/2007  . COPD 03/24/2007  . Ganglion of Joint 03/24/2007  . TB SKIN TEST, POSITIVE, HX OF 03/24/2007  . HYSTERECTOMY, PARTIAL, HX OF 03/24/2007  . CEREBRAL ANEURYSM 01/05/2003    Stable, post right mastectomy. May have had some bleeding from flap, but appears stable  Plan: Will check CBC and re-examine the wound later today and monitor JP output  Subjective: Patient feels good, minimal pain, wants to go home.  Objective: Vital signs in last 24 hours: Temp:  [96 F (35.6 C)-99.4 F (37.4 C)] 98.6 F (37 C) (11/15 0620) Pulse Rate:  [49-74] 72 (11/15 0620) Resp:  [13-19] 18 (11/15 0620) BP: (113-163)/(57-93) 113/73 mmHg (11/15 0620) SpO2:  [94 %-100 %] 99 % (11/15 0620) Weight:  [132 lb 15 oz (60.3 kg)] 132 lb 15 oz (60.3 kg) (11/14 1225)   Intake/Output from previous day: 11/14 0701 - 11/15 0700 In: 1236 [P.O.:400; I.V.:836] Out: 220 [Drains:220]  General appearance: alert, cooperative and no distress Resp: clear to auscultation bilaterally Cardio: regular rate and rhythm, S1, S2 normal, no murmur, click, rub or gallop .  Incision: Wound clean, may nave a hematoma under flaps, or may be thick flaps. JP put out 175 overnight after very little first few hours. Blood in JP now and leaked some blood around drain  Lab Results:   Recent Labs  08/20/13 0840  WBC 8.1  HGB 13.3  HCT 39.3  PLT 295   BMET  Recent Labs  08/20/13 0840  NA 143  K 3.7  CL 108  CO2 23  GLUCOSE 76  BUN 14  CREATININE 1.23*  CALCIUM 9.2    MEDS, Scheduled . amLODipine  10 mg Oral Daily  . enalapril  5 mg Oral  Daily  . gabapentin  300 mg Oral TID  . glimepiride  2 mg Oral Q breakfast  . insulin aspart  0-9 Units Subcutaneous TID WC  . mometasone-formoterol  2 puff Inhalation BID  . nortriptyline  25 mg Oral QHS    Studies/Results: Dg Chest 2 View  08/20/2013   CLINICAL DATA:  Preop for mastectomy  EXAM: CHEST  2 VIEW  COMPARISON:  None.  FINDINGS: Cardiomediastinal silhouette is unremarkable. Mild hyperinflation. No acute infiltrate or pleural effusion. No pulmonary edema. Thoracic spine osteopenia.  IMPRESSION: No active disease.  Mild hyperinflation.  Thoracic spine osteopenia.   Electronically Signed   By: Natasha Mead M.D.   On: 08/20/2013 10:00   Nm Sentinel Node Inj-no Rpt (breast)  08/21/2013   CLINICAL DATA: right axillary sentinel node biopsy   Sulfur colloid was injected intradermally by the nuclear medicine  technologist for breast cancer sentinel node localization.       LOS: 1 day     Currie Paris, MD, [facility], [place] [phone]   08/22/2013 8:00 AM

## 2013-08-22 NOTE — Care Management Utilization Note (Signed)
UR completed.    Suresh Audi Wise Konnor Vondrasek, RN, BSN Phone #[phone]  

## 2013-08-23 LAB — GLUCOSE, CAPILLARY: Glucose-Capillary: 89 mg/dL (ref 70–99)

## 2013-08-23 MED ORDER — OXYCODONE-ACETAMINOPHEN 5-325 MG PO TABS: 1.0000 | ORAL_TABLET | ORAL | Status: DC | PRN

## 2013-08-23 NOTE — Progress Notes (Signed)
2 Days Post-Op   Assessment: s/p Procedure(s): MASTECTOMY WITH SENTINEL LYMPH NODE BIOPSY Patient Active Problem List   Diagnosis Date Noted  . 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 GOUT 03/24/2007  . ALCOHOL ABUSE 03/24/2007  . PERIPHERAL NEUROPATHY 03/24/2007  . HYPERTENSION, ESSENTIAL NOS 03/24/2007  . CEREBROVASCULAR DISEASE LATE EFFECTS, NOS 03/24/2007  . COPD 03/24/2007  . Ganglion of Joint 03/24/2007  . TB SKIN TEST, POSITIVE, HX OF 03/24/2007  . HYSTERECTOMY, PARTIAL, HX OF 03/24/2007  . CEREBRAL ANEURYSM 01/05/2003    Improved, ready to go home  Plan: Discharge  Subjective: Feells better today, not much pain and would like to go home  Objective: Vital signs in last 24 hours: Temp:  [98.4 F (36.9 C)-98.8 F (37.1 C)] 98.4 F (36.9 C) (11/16 0603) Pulse Rate:  [65-71] 65 (11/16 0603) Resp:  [16-18] 16 (11/16 0603) BP: (98-140)/(55-73) 98/55 mmHg (11/16 0603) SpO2:  [95 %-100 %] 95 % (11/16 0603)   Intake/Output from previous day: 11/15 0701 - 11/16 0700 In: 1358 [I.V.:1358] Out: 90 [Drains:90]  General appearance: alert, cooperative and no distress Resp: clear to auscultation bilaterally  Incision: healing well, Drain now red gatorade, flaps look good and no hematoma apparent today  Lab Results:   Recent Labs  08/20/13 0840 08/22/13 1008  WBC 8.1 8.1  HGB 13.3 10.4*  HCT 39.3 31.2*  PLT 295 229   BMET  Recent Labs  08/20/13 0840  NA 143  K 3.7  CL 108  CO2 23  GLUCOSE 76  BUN 14  CREATININE 1.23*  CALCIUM 9.2    MEDS, Scheduled . amLODipine  10 mg Oral Daily  . enalapril  5 mg Oral Daily  . gabapentin  300 mg Oral TID  . glimepiride  2 mg Oral Q breakfast  . insulin aspart  0-9 Units Subcutaneous TID WC  . mometasone-formoterol  2 puff Inhalation BID  . nortriptyline  25 mg Oral QHS    Studies/Results: Nm Sentinel Node Inj-no Rpt (breast)  08/21/2013   CLINICAL DATA: right axillary sentinel node biopsy    Sulfur colloid was injected intradermally by the nuclear medicine  technologist for breast cancer sentinel node localization.       LOS: 2 days     Currie Paris, MD, [facility], [place] [phone]   08/23/2013 7:24 AM

## 2013-08-23 NOTE — Progress Notes (Signed)
Discharge instructions gone over with patient. Home medications gone over with patient. Will take daily medications when she gets home today. Prescription given. Patient demonstrated proper technique emptying drain. Diet, activity, incisional care, and signs and symptoms of infection gone over. My chart discussed. Follow up appointment to be made. Arm precautions discussed. Patient verbalized understanding of instructions.

## 2013-08-24 ENCOUNTER — Telehealth: Payer: Self-pay | Admitting: *Deleted

## 2013-08-24 ENCOUNTER — Other Ambulatory Visit: Payer: Self-pay | Admitting: Oncology

## 2013-08-24 ENCOUNTER — Encounter (HOSPITAL_COMMUNITY): Payer: Self-pay | Admitting: General Surgery

## 2013-08-24 ENCOUNTER — Telehealth (INDEPENDENT_AMBULATORY_CARE_PROVIDER_SITE_OTHER): Payer: Self-pay

## 2013-08-24 LAB — GLUCOSE, CAPILLARY: Glucose-Capillary: 74 mg/dL (ref 70–99)

## 2013-08-24 NOTE — Telephone Encounter (Signed)
Confirmed 09/15/13 appt w/ pt.  Mailed before appt letter, welcome packet & intake form to pt.  Emailed Dr. Dwain Sarna at [facility] to make him aware.  Took paperwork to Med Rec for chart.

## 2013-08-24 NOTE — Telephone Encounter (Signed)
Called pt to check on her from the weekend and make her an appt to see Dr Dwain Sarna only. The pt will need to see Dr Dwain Sarna on 08/31/13 at 9:00. The pt understands. The pt will not need a nurse visit this week per Dr Dwain Sarna about the drain that she will just need to see him on 08/31/13.

## 2013-08-25 ENCOUNTER — Telehealth (INDEPENDENT_AMBULATORY_CARE_PROVIDER_SITE_OTHER): Payer: Self-pay

## 2013-08-25 NOTE — Telephone Encounter (Signed)
Patient asking for dressing change supplies. Advised her we do not supply dressing for outpatients but she could pick up her supplies at [facility] or [facility]. Patient verbalized understanding. She states her appointment for Friday the 21st was cancelled by Alisha. Advised her appointment was for drain removal , I would have Alisha call her if this to be cancelled .

## 2013-08-25 NOTE — Telephone Encounter (Signed)
She doesn't really need to change a dressing until she returns

## 2013-08-25 NOTE — Telephone Encounter (Signed)
Called pt to see about her changing the dressings. The pt stated that the binder had blood on it from the area draining at night so she took the binder off and washed the binder. The pt just changed the pads and replaced it with new pads over the area. The pt put the washed binder back on till she see's Dr Dwain Sarna on Monday.

## 2013-08-26 NOTE — Discharge Summary (Signed)
Physician Discharge Summary  Patient ID: Pamela Hess MRN: [id number] DOB/AGE: 1945-10-11 67 y.o.  Admit date: 08/21/2013 Discharge date: 08/26/2013  Admission Diagnoses: Right breast DCIS  Discharge Diagnoses:  Principal Problem:   DCIS (ductal carcinoma in situ) of breast   Discharged Condition: good  Hospital Course: 60 yof s/p excisional biopsy of radiologic abnormality which showed dcis. She elected to undergo right mastectomy and sentinel node biopsy.  She did well with this. Had some bloody drainage and was kept an additional night and is doing well the following day.  Consults: None  Significant Diagnostic Studies: none  Treatments: surgery: right mastectomy, right axillary sentinel node biopsy    Disposition: 01-Home or Self Care  Discharge Orders   Future Appointments Provider Department Dept Phone   08/31/2013 9:00 AM Emelia Loron, MD [facility], [place] [phone]   09/15/2013 4:00 PM Mauri [facility] MEDICAL [facility] [phone]   09/15/2013 4:00 PM [facility] [place] [facility] [phone]   09/15/2013 4:30 PM Lowella Dell, MD [facility] MEDICAL ONCOLOGY [phone]   Future Orders Complete By Expires   Diet - low sodium heart healthy  As directed    Discharge instructions  As directed    Comments:     [facility] [place] [phone]  MASTECTOMY: POST OP INSTRUCTIONS  Always review your discharge instruction sheet given to you by the facility where your surgery was performed. IF YOU HAVE DISABILITY OR FAMILY LEAVE FORMS, YOU MUST BRING THEM TO THE OFFICE FOR PROCESSING.   DO NOT GIVE THEM TO YOUR DOCTOR. A prescription for pain medication may be given to you upon discharge.  Take your pain medication as prescribed, if needed.  If narcotic pain medicine is not needed, then you may take acetaminophen (Tylenol) or ibuprofen (Advil)  as needed. Take your usually prescribed medications unless otherwise directed. If you need a refill on your pain medication, please contact your pharmacy.  They will contact our office to request authorization.  Prescriptions will not be filled after 5pm or on week-ends. You should follow a light diet the first few days after arrival home, such as soup and crackers, etc.  Resume your normal diet the day after surgery. Most patients will experience some swelling and bruising on the chest and underarm.  Ice packs will help.  Swelling and bruising can take several days to resolve.  It is common to experience some constipation if taking pain medication after surgery.  Increasing fluid intake and taking a stool softener (such as Colace) will usually help or prevent this problem from occurring.  A mild laxative (Milk of Magnesia or Miralax) should be taken according to package instructions if there are no bowel movements after 48 hours. Unless discharge instructions indicate otherwise, leave your bandage dry and in place until your next appointment in 3-5 days.  You may take a limited sponge bath.  No tube baths or showers until the drains are removed.  You may have steri-strips (small skin tapes) in place directly over the incision.  These strips should be left on the skin for 7-10 days.  If your surgeon used skin glue on the incision, you may shower in 24 hours.  The glue will flake off over the next 2-3 weeks.  Any sutures or staples will be removed at the office during your follow-up visit. DRAINS:  If you have drains in place, it is important to keep a list of the  amount of drainage produced each day in your drains.  Before leaving the hospital, you should be instructed on drain care.  Call our office if you have any questions about your drains. ACTIVITIES:  You may resume regular (light) daily activities beginning the next day-such as daily self-care, walking, climbing stairs-gradually increasing activities  as tolerated.  You may have sexual intercourse when it is comfortable.  Refrain from any heavy lifting or straining until approved by your doctor. You may drive when you are no longer taking prescription pain medication, you can comfortably wear a seatbelt, and you can safely maneuver your car and apply brakes. RETURN TO WORK:  __________________________________________________________ Pamela Hess should see your doctor in the office for a follow-up appointment approximately 3-5 days after your surgery.  Your doctor's nurse will typically make your follow-up appointment when she calls you with your pathology report.  Expect your pathology report 2-3 business days after your surgery.  You may call to check if you do not hear from [place] after three days.   OTHER INSTRUCTIONS: ______________________________________________________________________________________________ ____________________________________________________________________________________________ WHEN TO CALL YOUR DOCTOR: Fever over 101.0 Nausea and/or vomiting Extreme swelling or bruising Continued bleeding from incision. Increased pain, redness, or drainage from the incision. The clinic staff is available to answer your questions during regular business hours.  Please don't hesitate to call and ask to speak to one of the nurses for clinical concerns.  If you have a medical emergency, go to the nearest emergency room or call 911.  A surgeon from [facility] is always on call at the hospital. [address], [place], [place]  [address] ? [address] [address], [place], [place]   [address] [phone] ? [phone] ? FAX [phone] Web site: [email or web address]   Increase activity slowly  As directed        Medication List         ADVAIR DISKUS 250-50 MCG/DOSE Aepb  Generic drug:  Fluticasone-Salmeterol  Inhale 12 puffs into the lungs daily as needed (for shortness of breath).     amLODipine 10 MG tablet  Commonly known as:   NORVASC  Take 10 mg by mouth daily.     atorvastatin 20 MG tablet  Commonly known as:  LIPITOR  Take 20 mg by mouth at bedtime.     enalapril 5 MG tablet  Commonly known as:  VASOTEC  Take 5 mg by mouth daily.     gabapentin 300 MG capsule  Commonly known as:  NEURONTIN  Take 300 mg by mouth 3 (three) times daily.     glimepiride 2 MG tablet  Commonly known as:  AMARYL  Take 2 mg by mouth daily with breakfast.     multivitamin with minerals Tabs tablet  Take 1 tablet by mouth daily.     nortriptyline 25 MG capsule  Commonly known as:  [name]  Take 25 mg by mouth at bedtime.     oxyCODONE-acetaminophen 5-325 MG per tablet  Commonly known as:  ROXICET  Take 1 tablet by mouth every 4 (four) hours as needed.     PROAIR HFA 108 (90 BASE) MCG/ACT inhaler  Generic drug:  albuterol  Inhale 1-2 puffs into the lungs every 6 (six) hours as needed for shortness of breath.     Vitamin D 2000 UNITS tablet  Take 2,000 Units by mouth daily.           Follow-up Information   Follow up with [facility], MD In 10 days.   Specialty:  General Surgery   Contact information:   [address] [place] [address] [phone]       Follow up with Emelia Loron, MD. Schedule an appointment as soon as possible for a visit in 1 week.   Specialty:  General Surgery   Contact information:   [address] [place] [address] [phone]       Signed: Emelia Loron 08/26/2013, 3:54 PM

## 2013-08-28 ENCOUNTER — Telehealth: Payer: Self-pay | Admitting: *Deleted

## 2013-08-28 ENCOUNTER — Encounter: Payer: Self-pay | Admitting: *Deleted

## 2013-08-28 ENCOUNTER — Encounter (INDEPENDENT_AMBULATORY_CARE_PROVIDER_SITE_OTHER): Payer: Medicare Other

## 2013-08-28 NOTE — Progress Notes (Signed)
[facility] Clinical Social Work  Clinical Social Work was referred by nurse for assessment of psychosocial needs due to transportation concerns.  Clinical Social Worker contacted patient at home to offer support and assess for needs.  CSW spoke with Pt at length about this issue. Pt has been very proactive and feels she may have a ride to her appointment on 09/15/13. Her biggest concern is a ride home, as her friend has to be at work by [employer]. Pt reports to not drive, but can use the bus. CSW educated Pt on other transportation resources and how CSW could assist. Pt plans to continue to try to get a ride for 12/9 on her own, as she feels she can be successful with this. She agrees to update CSW if she cannot get a ride. Pt reports to be coping well and was very appreciative.    Clinical Social Work interventions: Resource education Supportive listening.   Doreen Salvage, LCSW Clinical Social Worker [facility] Center for Patient & Family Support [facility] Wednesday, Thursday and Friday Phone: [phone] Fax: [phone]

## 2013-08-28 NOTE — Telephone Encounter (Signed)
Pt called stating that she has an appt on 12/9 and does not have any transportation to this visit.  I emailed Dawn and Cammy Copa to see if they can assist this pt on getting her to and from this visit.  I called the pt to make her aware that I did receive her message and I have sent this to someone that would look into it to see if we can help.

## 2013-08-31 ENCOUNTER — Encounter (INDEPENDENT_AMBULATORY_CARE_PROVIDER_SITE_OTHER): Payer: Self-pay | Admitting: General Surgery

## 2013-08-31 ENCOUNTER — Ambulatory Visit (INDEPENDENT_AMBULATORY_CARE_PROVIDER_SITE_OTHER): Payer: Medicare Other | Admitting: General Surgery

## 2013-08-31 ENCOUNTER — Encounter: Payer: Self-pay | Admitting: *Deleted

## 2013-08-31 ENCOUNTER — Other Ambulatory Visit: Payer: Self-pay | Admitting: *Deleted

## 2013-08-31 VITALS — BP 130/74 | HR 84 | Temp 98.6°F | Resp 15 | Ht 69.0 in | Wt 131.6 lb

## 2013-08-31 DIAGNOSIS — Z09 Encounter for follow-up examination after completed treatment for conditions other than malignant neoplasm: Secondary | ICD-10-CM

## 2013-08-31 DIAGNOSIS — C50411 Malignant neoplasm of upper-outer quadrant of right female breast: Secondary | ICD-10-CM | POA: Insufficient documentation

## 2013-08-31 NOTE — Progress Notes (Signed)
Received chart back from Dawn and placed in Dr. Magrinat's box.  

## 2013-08-31 NOTE — Progress Notes (Signed)
Subjective:     Patient ID: Pamela Hess, female   DOB: Sep 03, 1946, 67 y.o.   MRN: [id number]  HPI This is a 67 year old female that I did an excisional biopsy of calcifications that were not amenable to core biopsy with the result being a ductal carcinoma in situ. Since then she has undergone a right mastectomy and sentinel node biopsy with the pathology returning as a stage 0 breast cancer with clear margins. Her drain is still putting out around 30 cc per day of what is mostly old blood. She is otherwise doing well and is without complaint. She has an appointment at the cancer center next week.  Review of Systems     Objective:   Physical Exam Incision healing well without infection, drain was some old blood present    Assessment:     DCIS status post right mastectomy and sentinel node biopsy     Plan:     We will call her in a week to see how her drain is doing. Hopefully it will be able to come out at that point. I only put one drain in her would like to leave this another week with the output and her size. She is also due to be seen at the cancer center. She will call back sooner if she needs anything otherwise will plan on seeing her about a week. I discussed her pathology today.

## 2013-08-31 NOTE — Patient Instructions (Signed)
Measure drain output daily. We will call next Monday and if drain output is less than 30 cc or 1 oz per day for 2-3 days we will have you come in to take drain out.

## 2013-09-07 ENCOUNTER — Telehealth (INDEPENDENT_AMBULATORY_CARE_PROVIDER_SITE_OTHER): Payer: Self-pay | Admitting: General Surgery

## 2013-09-07 NOTE — Telephone Encounter (Signed)
Patient called to report her drainage. She states in the last 24 hours it has drained 1.2 oz. I explained that is still over 30 cc and that is too much drainage. I let her know I would inform Dr Dwain Sarna and his assistant.

## 2013-09-08 NOTE — Telephone Encounter (Signed)
Pt calling again today to report her drainage in last 24 hours is still at 1.2 oz. Explained to the patient that output was still over the 30 cc amount. After speaking with Dr. Doreen Salvage nurse, Elease Hashimoto, I advised the patient to call us back on Thursday of this week with a report.  The nurse will make the decision at that time when the drain should be pulled.  Pt understood and agreed with POC.

## 2013-09-10 NOTE — Telephone Encounter (Signed)
Patient called to report that her drainage this morning is .  Spoke to Vinita CMA who gave appt time for tomorrow @ 1045a.  Patient given appt and states understanding.

## 2013-09-11 ENCOUNTER — Encounter (INDEPENDENT_AMBULATORY_CARE_PROVIDER_SITE_OTHER): Payer: Self-pay | Admitting: General Surgery

## 2013-09-11 ENCOUNTER — Ambulatory Visit (INDEPENDENT_AMBULATORY_CARE_PROVIDER_SITE_OTHER): Payer: Medicare Other | Admitting: General Surgery

## 2013-09-11 VITALS — BP 150/88 | HR 76 | Temp 98.9°F | Resp 16 | Ht 69.0 in | Wt 133.0 lb

## 2013-09-11 DIAGNOSIS — Z09 Encounter for follow-up examination after completed treatment for conditions other than malignant neoplasm: Secondary | ICD-10-CM

## 2013-09-11 NOTE — Progress Notes (Signed)
Subjective:     Patient ID: Deneen Harts, female   DOB: 08-18-1946, 67 y.o.   MRN: [id number]  HPI This is a 67 year old female that I did an excisional biopsy of calcifications that were not amenable to core biopsy with the result being a ductal carcinoma in situ. Since then she has undergone a right mastectomy and sentinel node biopsy with the pathology returning as a stage 0 breast cancer with clear margins. Her drain is putting out less than 30 cc per day and she is doing well.   Review of Systems     Objective:   Physical Exam Left mastectomy incision healing well without infection, drain with minimal fluid    Assessment:     S/p left mastectomy/sn     Plan:     I removed her drain today without difficulty. She's going to keep this covered until it heals over that. I will plan on seeing her back in 3 weeks just to make sure things continue to heal. She has an appointment at the cancer Center this week.

## 2013-09-15 ENCOUNTER — Encounter: Payer: Self-pay | Admitting: Oncology

## 2013-09-15 ENCOUNTER — Ambulatory Visit: Payer: Medicare Other

## 2013-09-15 ENCOUNTER — Ambulatory Visit (HOSPITAL_BASED_OUTPATIENT_CLINIC_OR_DEPARTMENT_OTHER): Payer: Medicare Other | Admitting: Oncology

## 2013-09-15 ENCOUNTER — Other Ambulatory Visit (HOSPITAL_BASED_OUTPATIENT_CLINIC_OR_DEPARTMENT_OTHER): Payer: Medicare Other | Admitting: Lab

## 2013-09-15 VITALS — BP 143/74 | HR 83 | Temp 99.0°F | Resp 17 | Ht 69.0 in | Wt 134.4 lb

## 2013-09-15 DIAGNOSIS — Z9010 Acquired absence of unspecified breast and nipple: Secondary | ICD-10-CM

## 2013-09-15 DIAGNOSIS — Z170 Estrogen receptor positive status [ER+]: Secondary | ICD-10-CM

## 2013-09-15 DIAGNOSIS — D0590 Unspecified type of carcinoma in situ of unspecified breast: Secondary | ICD-10-CM

## 2013-09-15 DIAGNOSIS — C50419 Malignant neoplasm of upper-outer quadrant of unspecified female breast: Secondary | ICD-10-CM

## 2013-09-15 DIAGNOSIS — C50411 Malignant neoplasm of upper-outer quadrant of right female breast: Secondary | ICD-10-CM

## 2013-09-15 LAB — COMPREHENSIVE METABOLIC PANEL (CC13)
ALT: 13 U/L (ref 0–55)
AST: 15 U/L (ref 5–34)
Albumin: 3.8 g/dL (ref 3.5–5.0)
Alkaline Phosphatase: 105 U/L (ref 40–150)
Anion Gap: 14 mEq/L — ABNORMAL HIGH (ref 3–11)
Calcium: 9.4 mg/dL (ref 8.4–10.4)
Chloride: 109 mEq/L (ref 98–109)
Creatinine: 1.5 mg/dL — ABNORMAL HIGH (ref 0.6–1.1)
Potassium: 4 mEq/L (ref 3.5–5.1)

## 2013-09-15 LAB — CBC WITH DIFFERENTIAL/PLATELET
BASO%: 1.2 % (ref 0.0–2.0)
EOS%: 1.8 % (ref 0.0–7.0)
HGB: 11.9 g/dL (ref 11.6–15.9)
MCH: 32.1 pg (ref 25.1–34.0)
MCHC: 33.1 g/dL (ref 31.5–36.0)
MCV: 97 fL (ref 79.5–101.0)
MONO#: 0.6 10*3/uL (ref 0.1–0.9)
MONO%: 7.3 % (ref 0.0–14.0)
RDW: 13.9 % (ref 11.2–14.5)
WBC: 8.2 10*3/uL (ref 3.9–10.3)
lymph#: 3.1 10*3/uL (ref 0.9–3.3)

## 2013-09-15 NOTE — Progress Notes (Signed)
Checked in new pt with no financial concerns. °

## 2013-09-17 ENCOUNTER — Encounter: Payer: Self-pay | Admitting: *Deleted

## 2013-09-17 NOTE — Progress Notes (Signed)
[facility] Clinical Social Work  Clinical Social Work was referred by nurse for assessment of psychosocial needs.  Clinical Social Worker phoned patient at home to offer support and assess for needs. Pt reports to be complete with her treatment. She is able to use the bus by her report, but often gets confused with directions. She denies current transportation issues. Pt reports she is eager to be independent and likes to know about resources to assist her in the community. Pt reports to be in good spirits currently and has a "good attitude". Pt plans to apply for medicaid and receives disability currently.  Pt did report some issues with food insecurity recently and receives $16 in food stamps. She is aware of how to access food asst at [employer] and [employer]. Pt feels these resources and the ability of her grandson to asst her are helping currently. Pt denies other concerns and agrees to call CSW back if other needs arise.     Clinical Social Work interventions: Supportive Mining engineer and referral  Doreen Salvage, LCSW Clinical Social Worker [facility] for Patient & Family Support [facility] Wednesday, Thursday and Friday Phone: [phone] Fax: [phone]

## 2013-09-18 NOTE — Progress Notes (Signed)
IDJohnie Stadel Hess OB: 09-10-1946  MR#: [id number]  WUJ#:[id number]  PCP: Pamela German, MD GYN:   SU: Pamela Hess OTHER MD:  CHIEF COMPLAINT: "I lost my breast".  HISTORY OF PRESENT ILLNESS: [name] [facility]. On 06/04/2013, calcifications in the right breast were noted and additional views were performed 06/18/2013, showing heterogeneously dense breasts (category C.). There were 2 clusters of calcification in the lateral right breast each measuring about 2 mm. There were no associated masses. Biopsy of the more superficial area of calcification in the right breast was planned for 06/18/2013, but do to the patient's breast size and it location of the calcifications, this could not be done stereotactically.  Accordingly the patient was referred for excisional biopsy, and this was performed 07/28/2013. The pathology showed ([id number]) ductal carcinoma in situ, low-grade, with multiple positive margins (superior, deep, inferior and superficial). Estrogen receptor was 100% positive, progesterone receptor was 96% positive, both with strong staining intensity.  Given those results: The patient proceeded to right mastectomy with sentinel lymph node sampling 08/21/2013. The pathology ([id number]) showed residual ductal carcinoma in situ, low-grade, spanning approximately 1.75 cm. The posterior margin was close but negative. Other margins were cleared, and both sentinel lymph nodes were benign.  The patient's subsequent history is as detailed below  INTERVAL HISTORY: Margin was evaluated in the breast clinic 09/15/2013. Her daughter Pamela Hess came with the patient about preferred to stay in the lobby.  REVIEW OF SYSTEMS: Pamela Hess did well with the surgery, without significant pain, fever, rash, bleeding, or other complications. She has some chronic problems which are not related to her breast cancer diagnosis. This includes problems  with her dentures, need for glasses, shortness of breath when walking up stairs, poor appetite, arthritis, diabetes and gout. A detailed review of systems today was otherwise noncontributory   PAST MEDICAL HISTORY: Past Medical History  Diagnosis Date  . Hyperlipidemia   . Hypertension   . Asthma   . Diabetes mellitus without complication   . Stroke   . Wears glasses     readers  . Full dentures   . Chronic kidney disease   . Peripheral neuropathy   . History of cerebral aneurysm repair   . Cancer 2014    BREAST CANCER    PAST SURGICAL HISTORY: Past Surgical History  Procedure Laterality Date  . Ganglion cyst excision  1968    rt wrist  . Cerebral aneurysm repair  1999  . Breast biopsy Right 07/28/2013    Procedure: BREAST BIOPSY WITH NEEDLE LOCALIZATION;  Surgeon: Pamela Loron, MD;  Location: [facility];  Service: General;  Laterality: Right;  needle loc BGC   . Mastectomy w/ sentinel node biopsy Right 08/21/2013    Dr Pamela Hess   . Mastectomy w/ sentinel node biopsy Right 08/21/2013    Procedure: MASTECTOMY WITH SENTINEL LYMPH NODE BIOPSY;  Surgeon: Pamela Loron, MD;  Location: [facility] OR;  Service: General;  Laterality: Right;    FAMILY HISTORY Family History  Problem Relation Age of Onset  . Cancer Mother     colon  The patient's father died at the age of 30 1 according to the patient was a subarachnoid hemorrhage. The patient's mother died at the age of 45 with tuberculosis. The patient had no siblings. There is no history of breast or ovarian cancer in the family. One uncle on the mother's side was diagnosed with colon cancer at age 63  GYNECOLOGIC HISTORY:  Menarche age 18, first live birth age 89, the patient is GX P3 .she went through menopause 10 or 15 years ago. She did not take hormone replacement.   SOCIAL HISTORY:  [name] worked for [employer] in [place] for 13 years. 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  She is now retired. At home she lives with her 34 year old grandson, Pamela Hess. The patient's daughter Pamela Hess lives in [place] where she works as a Lawyer. The patient had a son who was "shot twice in the back at age 60.". The third child, a son, was raised by his father, and though you lives in [place], the patient is not in touch with him.    ADVANCED DIRECTIVES: In place. The patient has named her daughter asked her healthcare power of attorney.   HEALTH MAINTENANCE: History  Substance Use Topics  . Smoking status: Current Every Day Smoker -- 0.25 packs/day    Types: Cigarettes  . Smokeless tobacco: Never Used  . Alcohol Use: No     Colonoscopy:2010  PAP:2012  Bone density:2013  Lipid panel:  No Known Allergies  Current Outpatient Prescriptions  Medication Sig Dispense Refill  . ADVAIR DISKUS 250-50 MCG/DOSE AEPB Inhale 12 puffs into the lungs daily as needed (for shortness of breath).       Marland Kitchen amLODipine (NORVASC) 10 MG tablet Take 10 mg by mouth daily.       Marland Kitchen atorvastatin (LIPITOR) 20 MG tablet Take 20 mg by mouth at bedtime.       . Cholecalciferol (VITAMIN D) 2000 UNITS tablet Take 2,000 Units by mouth daily.      . enalapril (VASOTEC) 5 MG tablet Take 5 mg by mouth daily.       Marland Kitchen gabapentin (NEURONTIN) 300 MG capsule Take 300 mg by mouth 3 (three) times daily.       Marland Kitchen glimepiride (AMARYL) 2 MG tablet Take 2 mg by mouth daily with breakfast.       . Multiple Vitamin (MULTIVITAMIN WITH MINERALS) TABS tablet Take 1 tablet by mouth daily.      . nortriptyline ([name]) 25 MG capsule Take 25 mg by mouth at bedtime.      Marland Kitchen PROAIR HFA 108 (90 BASE) MCG/ACT inhaler Inhale 1-2 puffs into the lungs every 6 (six) hours as needed for shortness of breath.        No current facility-administered medications for this visit.    OBJECTIVE: Middle-aged [place] American woman who appears well  Filed Vitals:   09/15/13 1620  BP: 143/74  Pulse: 83  Temp: 99 F  (37.2 C)  Resp: 17     Body mass index is 19.84 kg/(m^2).    ECOG FS:0 - Asymptomatic  Ocular: Sclerae unicteric, pupils equal and round  Ear-nose-throat: Oropharynx clear,  very poor dentition  Lymphatic: No cervical or supraclavicular adenopathy Lungs no rales or rhonchi, good excursion bilaterally Heart regular rate and rhythm, no murmur appreciated Abd soft, nontender, positive bowel sounds MSK no focal spinal tenderness, noupper extremity lymphedema  Neuro: non-focal, well-oriented,  pleasant  affect Breasts: The right breast is status post mastectomy. The incision is healing nicely, with no dehiscence, swelling, erythema, or unusual tenderness. The right axilla is benign. The left breast is unremarkable.  LAB RESULTS:  CMP     Component Value Date/Time   NA 145 09/15/2013 1608   NA 143 08/20/2013 0840   K 4.0 09/15/2013 1608   K 3.7 08/20/2013 0840  CL 108 08/20/2013 0840   CO2 23 09/15/2013 1608   CO2 23 08/20/2013 0840   GLUCOSE 81 09/15/2013 1608   GLUCOSE 76 08/20/2013 0840   BUN 15.9 09/15/2013 1608   BUN 14 08/20/2013 0840   CREATININE 1.5* 09/15/2013 1608   CREATININE 1.23* 08/20/2013 0840   CALCIUM 9.4 09/15/2013 1608   CALCIUM 9.2 08/20/2013 0840   PROT 7.2 09/15/2013 1608   PROT 7.0 04/11/2010 2307   ALBUMIN 3.8 09/15/2013 1608   ALBUMIN 4.3 04/11/2010 2307   AST 15 09/15/2013 1608   AST 13 04/11/2010 2307   ALT 13 09/15/2013 1608   ALT 9 04/11/2010 2307   ALKPHOS 105 09/15/2013 1608   ALKPHOS 94 04/11/2010 2307   BILITOT 0.34 09/15/2013 1608   BILITOT 0.3 04/11/2010 2307   GFRNONAA 45* 08/20/2013 0840   GFRAA 52* 08/20/2013 0840    I No results found for this basename: SPEP, UPEP,  kappa and lambda light chains    Lab Results  Component Value Date   WBC 8.2 09/15/2013   NEUTROABS 4.3 09/15/2013   HGB 11.9 09/15/2013   HCT 35.9 09/15/2013   MCV 97.0 09/15/2013   PLT 247 09/15/2013      Chemistry      Component Value Date/Time   NA 145 09/15/2013 1608   NA 143  08/20/2013 0840   K 4.0 09/15/2013 1608   K 3.7 08/20/2013 0840   CL 108 08/20/2013 0840   CO2 23 09/15/2013 1608   CO2 23 08/20/2013 0840   BUN 15.9 09/15/2013 1608   BUN 14 08/20/2013 0840   CREATININE 1.5* 09/15/2013 1608   CREATININE 1.23* 08/20/2013 0840      Component Value Date/Time   CALCIUM 9.4 09/15/2013 1608   CALCIUM 9.2 08/20/2013 0840   ALKPHOS 105 09/15/2013 1608   ALKPHOS 94 04/11/2010 2307   AST 15 09/15/2013 1608   AST 13 04/11/2010 2307   ALT 13 09/15/2013 1608   ALT 9 04/11/2010 2307   BILITOT 0.34 09/15/2013 1608   BILITOT 0.3 04/11/2010 2307       No results found for this basename: LABCA2    No components found with this basename: LABCA125    No results found for this basename: INR,  in the last 168 hours  Urinalysis No results found for this basename: colorurine, appearanceur, labspec, phurine, glucoseu, hgbur, bilirubinur, ketonesur, proteinur, urobilinogen, nitrite, leukocytesur    STUDIES: Dg Chest 2 View  08/20/2013   CLINICAL DATA:  Preop for mastectomy  EXAM: CHEST  2 VIEW  COMPARISON:  None.  FINDINGS: Cardiomediastinal silhouette is unremarkable. Mild hyperinflation. No acute infiltrate or pleural effusion. No pulmonary edema. Thoracic spine osteopenia.  IMPRESSION: No active disease.  Mild hyperinflation.  Thoracic spine osteopenia.   Electronically Signed   By: Natasha Mead M.D.   On: 08/20/2013 10:00   Nm Sentinel Node Inj-no Rpt (breast)  08/21/2013   CLINICAL DATA: right axillary sentinel node biopsy   Sulfur colloid was injected intradermally by the nuclear medicine  technologist for breast cancer sentinel node localization.     ASSESSMENT: 67 y.o. [place] woman status post right lumpectomy 07/28/2013 for low-grade ductal carcinoma in situ, measuring at least 2.4 cm, with multiple positive margins. Estrogen receptor was 100% positive and progesterone receptor 96% positive, both with strong staining intensity  (1) status post right mastectomy and  sentinel lymph node sampling 08/21/2013 for residual low-grade ductal carcinoma in situ measuring 1.75 cm, with a close but negative posterior  margin, and both sentinel lymph nodes negative.  PLAN: We spent the better part of today's hour-long appointment discussing the biology of breast cancer in general, and the specifics of the patient's tumor in particular. Gavin understands that her breast cancer was noninvasive, and therefore the cells, trapped in the ducts, could not travel to vital organs. Accordingly his cancer is in itself not life-threatening.  Furthermore, because all the cancer was contained in the milk ducts, and all the milk ducts are removed at the time of mastectomy, mastectomy cures this cancer 99% at that time. Accordingly Jax is essentially done with this cancer. The fact that it was estrogen and progesterone receptor positive is not an indication for treatment.  She could, however, consider antiestrogens for prevention of a new breast cancer developing in the remaining breast. The risk of that happening in her case would be approximately 1/2% per year. We discussed the possible toxicities, side effects and complications of antiestrogen soft, and after much discussion the patient preferred not to start these agents. I do not disagree with that decision, as the benefit in her case, if any, would be small  Given all that, I am comfortable releasing Audreyana to the care of her primary care physician. She has a good understanding of this plan, and agrees with it. She knows the goal of her treatment has been cure.   She will need a yearly breast and chest exam by a physician, and a yearly left mammogram. Of course we will be glad to see Vinnie at anytime if the need arises, but as of now we are making no routine appointments for her here    Lowella Dell, MD   09/18/2013 4:29 PM

## 2013-09-21 ENCOUNTER — Telehealth: Payer: Self-pay | Admitting: Oncology

## 2013-09-21 ENCOUNTER — Encounter: Payer: Self-pay | Admitting: *Deleted

## 2013-09-21 NOTE — Telephone Encounter (Signed)
Sent letter to Dr. Fleet Contras office from Dr. Darnelle Catalan.

## 2013-09-21 NOTE — Progress Notes (Signed)
Mailed after appt letter to pt. 

## 2013-10-05 ENCOUNTER — Ambulatory Visit (INDEPENDENT_AMBULATORY_CARE_PROVIDER_SITE_OTHER): Payer: Medicare Other | Admitting: General Surgery

## 2013-10-05 ENCOUNTER — Encounter (INDEPENDENT_AMBULATORY_CARE_PROVIDER_SITE_OTHER): Payer: Self-pay | Admitting: General Surgery

## 2013-10-05 VITALS — BP 140/86 | HR 60 | Temp 98.9°F | Resp 14 | Ht 69.0 in | Wt 131.6 lb

## 2013-10-05 DIAGNOSIS — Z09 Encounter for follow-up examination after completed treatment for conditions other than malignant neoplasm: Secondary | ICD-10-CM

## 2013-10-05 NOTE — Patient Instructions (Signed)

## 2013-10-05 NOTE — Progress Notes (Signed)
Subjective:     Patient ID: Pamela Hess, female   DOB: 20-Nov-1945, 67 y.o.   MRN: [id number]  HPI This is a 67 year old female that I did an excisional biopsy of calcifications that were not amenable to core biopsy with the result being a ductal carcinoma in situ. Since then she has undergone a right mastectomy and sentinel node biopsy with the pathology returning as a stage 0 breast cancer with clear margins. She returns today doing well. She has been seen by medical oncology as decided not to undergo any adjuvant treatment. She has no complaints. Her arm is moving well.    Review of Systems     Objective:   Physical Exam Right mastectomy incision healed, no infection, no masses    Assessment:     Status post right mastectomy for ductal carcinoma in situ     Plan:     She is doing well. I released her to full activity. We went over the fact that she still needs annual mammogram on the left side and needs to continue her clinical exams on both sides. She should be seen every 6 months for exam and I will plan on doing this.

## 2014-04-01 ENCOUNTER — Telehealth (INDEPENDENT_AMBULATORY_CARE_PROVIDER_SITE_OTHER): Payer: Self-pay

## 2014-04-01 NOTE — Telephone Encounter (Signed)
Pamela Hess will call pt to let her know that Dr Donne Hazel said this was fine.

## 2014-04-01 NOTE — Telephone Encounter (Signed)
Message copied by Illene Regulus on Thu Apr 01, 2014  2:23 PM ------      Message from: Donne Hazel, MATTHEW      Created: Thu Apr 01, 2014 12:13 PM      Regarding: RE: Dr Donne Hazel      Contact: [phone]       That is fine      ----- Message -----         From: Illene Regulus, MA         Sent: 04/01/2014  12:03 PM           To: Rolm Bookbinder, MD      Subject: FW: Dr Donne Hazel                                                     ----- Message -----         From: Tami Lin         Sent: 04/01/2014  11:41 AM           To: Illene Regulus, MA      Subject: Dr Donne Hazel                                             LTFU: pt doesn't think she needs to see Dr Donne Hazel anymore. Says she will keep seeing her PCP       ------

## 2014-09-18 NOTE — Telephone Encounter (Signed)
none

## 2014-12-21 DIAGNOSIS — J45909 Unspecified asthma, uncomplicated: Secondary | ICD-10-CM | POA: Diagnosis not present

## 2014-12-21 DIAGNOSIS — M10071 Idiopathic gout, right ankle and foot: Secondary | ICD-10-CM | POA: Diagnosis not present

## 2014-12-21 DIAGNOSIS — E784 Other hyperlipidemia: Secondary | ICD-10-CM | POA: Diagnosis not present

## 2014-12-21 DIAGNOSIS — I10 Essential (primary) hypertension: Secondary | ICD-10-CM | POA: Diagnosis not present

## 2014-12-21 DIAGNOSIS — E1140 Type 2 diabetes mellitus with diabetic neuropathy, unspecified: Secondary | ICD-10-CM | POA: Diagnosis not present

## 2015-02-10 DIAGNOSIS — D0591 Unspecified type of carcinoma in situ of right breast: Secondary | ICD-10-CM | POA: Diagnosis not present

## 2015-04-21 DIAGNOSIS — E119 Type 2 diabetes mellitus without complications: Secondary | ICD-10-CM | POA: Diagnosis not present

## 2015-04-21 DIAGNOSIS — I10 Essential (primary) hypertension: Secondary | ICD-10-CM | POA: Diagnosis not present

## 2015-04-21 DIAGNOSIS — E784 Other hyperlipidemia: Secondary | ICD-10-CM | POA: Diagnosis not present

## 2015-04-21 DIAGNOSIS — J45909 Unspecified asthma, uncomplicated: Secondary | ICD-10-CM | POA: Diagnosis not present

## 2015-04-21 DIAGNOSIS — E1140 Type 2 diabetes mellitus with diabetic neuropathy, unspecified: Secondary | ICD-10-CM | POA: Diagnosis not present

## 2015-08-23 DIAGNOSIS — F17200 Nicotine dependence, unspecified, uncomplicated: Secondary | ICD-10-CM | POA: Diagnosis not present

## 2015-08-23 DIAGNOSIS — Z23 Encounter for immunization: Secondary | ICD-10-CM | POA: Diagnosis not present

## 2015-08-23 DIAGNOSIS — M10071 Idiopathic gout, right ankle and foot: Secondary | ICD-10-CM | POA: Diagnosis not present

## 2015-08-23 DIAGNOSIS — I10 Essential (primary) hypertension: Secondary | ICD-10-CM | POA: Diagnosis not present

## 2015-08-23 DIAGNOSIS — J45909 Unspecified asthma, uncomplicated: Secondary | ICD-10-CM | POA: Diagnosis not present

## 2015-08-23 DIAGNOSIS — E1140 Type 2 diabetes mellitus with diabetic neuropathy, unspecified: Secondary | ICD-10-CM | POA: Diagnosis not present

## 2015-08-23 DIAGNOSIS — D493 Neoplasm of unspecified behavior of breast: Secondary | ICD-10-CM | POA: Diagnosis not present

## 2015-12-20 ENCOUNTER — Other Ambulatory Visit: Payer: Self-pay

## 2015-12-20 DIAGNOSIS — Z1231 Encounter for screening mammogram for malignant neoplasm of breast: Secondary | ICD-10-CM

## 2016-01-05 ENCOUNTER — Ambulatory Visit
Admission: RE | Admit: 2016-01-05 | Discharge: 2016-01-05 | Disposition: A | Discharge status: Home / Self Care | Payer: Medicare Other | Source: Ambulatory Visit

## 2016-01-05 DIAGNOSIS — Z1231 Encounter for screening mammogram for malignant neoplasm of breast: Secondary | ICD-10-CM

## 2016-11-23 ENCOUNTER — Other Ambulatory Visit: Payer: Self-pay | Admitting: Internal Medicine

## 2016-11-23 DIAGNOSIS — Z1231 Encounter for screening mammogram for malignant neoplasm of breast: Secondary | ICD-10-CM

## 2017-01-07 ENCOUNTER — Ambulatory Visit
Admission: RE | Admit: 2017-01-07 | Discharge: 2017-01-07 | Disposition: A | Discharge status: Home / Self Care | Payer: Medicare Other | Source: Ambulatory Visit | Attending: Internal Medicine | Admitting: Internal Medicine

## 2017-01-07 DIAGNOSIS — Z1231 Encounter for screening mammogram for malignant neoplasm of breast: Secondary | ICD-10-CM

## 2017-12-03 ENCOUNTER — Other Ambulatory Visit: Payer: Self-pay | Admitting: Internal Medicine

## 2017-12-03 DIAGNOSIS — Z853 Personal history of malignant neoplasm of breast: Secondary | ICD-10-CM

## 2017-12-10 ENCOUNTER — Other Ambulatory Visit: Payer: Self-pay | Admitting: Internal Medicine

## 2017-12-10 DIAGNOSIS — E2839 Other primary ovarian failure: Secondary | ICD-10-CM

## 2018-01-08 ENCOUNTER — Ambulatory Visit: Payer: Medicare Other

## 2018-01-10 ENCOUNTER — Ambulatory Visit
Admission: RE | Admit: 2018-01-10 | Discharge: 2018-01-10 | Disposition: A | Discharge status: Home / Self Care | Payer: Medicare Other | Source: Ambulatory Visit | Attending: Internal Medicine | Admitting: Internal Medicine

## 2018-01-10 ENCOUNTER — Ambulatory Visit: Payer: Medicare Other

## 2018-01-10 DIAGNOSIS — E2839 Other primary ovarian failure: Secondary | ICD-10-CM

## 2018-01-10 DIAGNOSIS — Z853 Personal history of malignant neoplasm of breast: Secondary | ICD-10-CM

## 2018-12-29 ENCOUNTER — Encounter (HOSPITAL_COMMUNITY): Payer: Medicare Other

## 2019-01-29 ENCOUNTER — Other Ambulatory Visit (HOSPITAL_COMMUNITY): Payer: Self-pay | Admitting: Internal Medicine

## 2019-01-29 ENCOUNTER — Ambulatory Visit (HOSPITAL_COMMUNITY): Payer: Medicare Other | Attending: Family

## 2019-01-29 DIAGNOSIS — I739 Peripheral vascular disease, unspecified: Secondary | ICD-10-CM

## 2019-02-13 ENCOUNTER — Other Ambulatory Visit: Payer: Self-pay | Admitting: Internal Medicine

## 2019-02-13 DIAGNOSIS — Z1231 Encounter for screening mammogram for malignant neoplasm of breast: Secondary | ICD-10-CM

## 2019-02-17 ENCOUNTER — Ambulatory Visit
Admission: RE | Admit: 2019-02-17 | Discharge: 2019-02-17 | Disposition: A | Discharge status: Home / Self Care | Payer: Medicare Other | Source: Ambulatory Visit | Attending: Internal Medicine | Admitting: Internal Medicine

## 2019-02-17 ENCOUNTER — Other Ambulatory Visit: Payer: Self-pay

## 2019-02-17 DIAGNOSIS — Z1231 Encounter for screening mammogram for malignant neoplasm of breast: Secondary | ICD-10-CM

## 2020-01-27 ENCOUNTER — Other Ambulatory Visit: Payer: Self-pay | Admitting: Internal Medicine

## 2020-01-27 DIAGNOSIS — Z1231 Encounter for screening mammogram for malignant neoplasm of breast: Secondary | ICD-10-CM

## 2020-01-29 ENCOUNTER — Other Ambulatory Visit: Payer: Self-pay | Admitting: Internal Medicine

## 2020-01-29 DIAGNOSIS — E2839 Other primary ovarian failure: Secondary | ICD-10-CM

## 2020-02-18 ENCOUNTER — Other Ambulatory Visit: Payer: Self-pay

## 2020-02-18 ENCOUNTER — Ambulatory Visit
Admission: RE | Admit: 2020-02-18 | Discharge: 2020-02-18 | Disposition: A | Discharge status: Home / Self Care | Payer: Medicare Other | Source: Ambulatory Visit | Attending: Internal Medicine | Admitting: Internal Medicine

## 2020-02-18 DIAGNOSIS — Z1231 Encounter for screening mammogram for malignant neoplasm of breast: Secondary | ICD-10-CM

## 2020-04-18 ENCOUNTER — Other Ambulatory Visit: Payer: Self-pay

## 2020-04-18 ENCOUNTER — Ambulatory Visit
Admission: RE | Admit: 2020-04-18 | Discharge: 2020-04-18 | Disposition: A | Discharge status: Home / Self Care | Payer: Medicare Other | Source: Ambulatory Visit | Attending: Internal Medicine | Admitting: Internal Medicine

## 2020-04-18 DIAGNOSIS — E2839 Other primary ovarian failure: Secondary | ICD-10-CM

## 2020-06-19 ENCOUNTER — Inpatient Hospital Stay (HOSPITAL_COMMUNITY): Payer: Medicare Other

## 2020-06-19 ENCOUNTER — Inpatient Hospital Stay (HOSPITAL_COMMUNITY)
Admission: EM | Admit: 2020-06-19 | Discharge: 2020-06-21 | DRG: 101 | Disposition: A | Discharge status: Home / Self Care | Payer: Medicare Other | Attending: Internal Medicine | Admitting: Internal Medicine

## 2020-06-19 ENCOUNTER — Emergency Department (HOSPITAL_COMMUNITY): Payer: Medicare Other

## 2020-06-19 ENCOUNTER — Encounter (HOSPITAL_COMMUNITY): Payer: Self-pay | Admitting: Emergency Medicine

## 2020-06-19 ENCOUNTER — Other Ambulatory Visit: Payer: Self-pay

## 2020-06-19 DIAGNOSIS — R471 Dysarthria and anarthria: Secondary | ICD-10-CM | POA: Diagnosis present

## 2020-06-19 DIAGNOSIS — J4520 Mild intermittent asthma, uncomplicated: Secondary | ICD-10-CM | POA: Diagnosis present

## 2020-06-19 DIAGNOSIS — Z79899 Other long term (current) drug therapy: Secondary | ICD-10-CM

## 2020-06-19 DIAGNOSIS — N1831 Chronic kidney disease, stage 3a: Secondary | ICD-10-CM | POA: Diagnosis present

## 2020-06-19 DIAGNOSIS — Z716 Tobacco abuse counseling: Secondary | ICD-10-CM

## 2020-06-19 DIAGNOSIS — I10 Essential (primary) hypertension: Secondary | ICD-10-CM | POA: Diagnosis not present

## 2020-06-19 DIAGNOSIS — I639 Cerebral infarction, unspecified: Secondary | ICD-10-CM | POA: Diagnosis present

## 2020-06-19 DIAGNOSIS — R4701 Aphasia: Secondary | ICD-10-CM | POA: Diagnosis present

## 2020-06-19 DIAGNOSIS — N189 Chronic kidney disease, unspecified: Secondary | ICD-10-CM | POA: Diagnosis present

## 2020-06-19 DIAGNOSIS — Z7984 Long term (current) use of oral hypoglycemic drugs: Secondary | ICD-10-CM

## 2020-06-19 DIAGNOSIS — Z853 Personal history of malignant neoplasm of breast: Secondary | ICD-10-CM

## 2020-06-19 DIAGNOSIS — Z8673 Personal history of transient ischemic attack (TIA), and cerebral infarction without residual deficits: Secondary | ICD-10-CM

## 2020-06-19 DIAGNOSIS — Z720 Tobacco use: Secondary | ICD-10-CM | POA: Diagnosis not present

## 2020-06-19 DIAGNOSIS — E1122 Type 2 diabetes mellitus with diabetic chronic kidney disease: Secondary | ICD-10-CM | POA: Diagnosis present

## 2020-06-19 DIAGNOSIS — Z20822 Contact with and (suspected) exposure to covid-19: Secondary | ICD-10-CM | POA: Diagnosis present

## 2020-06-19 DIAGNOSIS — R569 Unspecified convulsions: Secondary | ICD-10-CM | POA: Diagnosis present

## 2020-06-19 DIAGNOSIS — N179 Acute kidney failure, unspecified: Secondary | ICD-10-CM | POA: Diagnosis present

## 2020-06-19 DIAGNOSIS — E1151 Type 2 diabetes mellitus with diabetic peripheral angiopathy without gangrene: Secondary | ICD-10-CM | POA: Diagnosis present

## 2020-06-19 DIAGNOSIS — E1142 Type 2 diabetes mellitus with diabetic polyneuropathy: Secondary | ICD-10-CM | POA: Diagnosis present

## 2020-06-19 DIAGNOSIS — M109 Gout, unspecified: Secondary | ICD-10-CM | POA: Diagnosis present

## 2020-06-19 DIAGNOSIS — I129 Hypertensive chronic kidney disease with stage 1 through stage 4 chronic kidney disease, or unspecified chronic kidney disease: Secondary | ICD-10-CM | POA: Diagnosis present

## 2020-06-19 DIAGNOSIS — Z9010 Acquired absence of unspecified breast and nipple: Secondary | ICD-10-CM

## 2020-06-19 DIAGNOSIS — C50411 Malignant neoplasm of upper-outer quadrant of right female breast: Secondary | ICD-10-CM | POA: Diagnosis present

## 2020-06-19 DIAGNOSIS — J449 Chronic obstructive pulmonary disease, unspecified: Secondary | ICD-10-CM | POA: Diagnosis present

## 2020-06-19 DIAGNOSIS — E785 Hyperlipidemia, unspecified: Secondary | ICD-10-CM | POA: Diagnosis present

## 2020-06-19 DIAGNOSIS — Z8679 Personal history of other diseases of the circulatory system: Secondary | ICD-10-CM | POA: Diagnosis not present

## 2020-06-19 DIAGNOSIS — F17210 Nicotine dependence, cigarettes, uncomplicated: Secondary | ICD-10-CM | POA: Diagnosis present

## 2020-06-19 LAB — COMPREHENSIVE METABOLIC PANEL
ALT: 12 U/L (ref 0–44)
AST: 16 U/L (ref 15–41)
Albumin: 3.7 g/dL (ref 3.5–5.0)
Alkaline Phosphatase: 95 U/L (ref 38–126)
Anion gap: 13 (ref 5–15)
BUN: 11 mg/dL (ref 8–23)
CO2: 20 mmol/L — ABNORMAL LOW (ref 22–32)
Calcium: 9.1 mg/dL (ref 8.9–10.3)
Chloride: 106 mmol/L (ref 98–111)
Creatinine, Ser: 1.45 mg/dL — ABNORMAL HIGH (ref 0.44–1.00)
GFR calc Af Amer: 41 mL/min — ABNORMAL LOW (ref >60)
GFR calc non Af Amer: 36 mL/min — ABNORMAL LOW (ref >60)
Glucose, Bld: 119 mg/dL — ABNORMAL HIGH (ref 70–99)
Potassium: 3.9 mmol/L (ref 3.5–5.1)
Sodium: 139 mmol/L (ref 135–145)
Total Bilirubin: 0.6 mg/dL (ref 0.3–1.2)
Total Protein: 7.1 g/dL (ref 6.5–8.1)

## 2020-06-19 LAB — I-STAT CHEM 8, ED
BUN: 13 mg/dL (ref 8–23)
Calcium, Ion: 1.09 mmol/L — ABNORMAL LOW (ref 1.15–1.40)
Chloride: 106 mmol/L (ref 98–111)
Creatinine, Ser: 1.3 mg/dL — ABNORMAL HIGH (ref 0.44–1.00)
Glucose, Bld: 115 mg/dL — ABNORMAL HIGH (ref 70–99)
HCT: 43 % (ref 36.0–46.0)
Hemoglobin: 14.6 g/dL (ref 12.0–15.0)
Potassium: 3.8 mmol/L (ref 3.5–5.1)
Sodium: 141 mmol/L (ref 135–145)
TCO2: 22 mmol/L (ref 22–32)

## 2020-06-19 LAB — CBC
HCT: 43 % (ref 36.0–46.0)
Hemoglobin: 13.8 g/dL (ref 12.0–15.0)
MCH: 29.6 pg (ref 26.0–34.0)
MCHC: 32.1 g/dL (ref 30.0–36.0)
MCV: 92.3 fL (ref 80.0–100.0)
Platelets: 211 10*3/uL (ref 150–400)
RBC: 4.66 MIL/uL (ref 3.87–5.11)
RDW: 14.1 % (ref 11.5–15.5)
WBC: 8.5 10*3/uL (ref 4.0–10.5)
nRBC: 0 % (ref 0.0–0.2)

## 2020-06-19 LAB — ECHOCARDIOGRAM COMPLETE: Weight: 2243.4 oz

## 2020-06-19 LAB — DIFFERENTIAL
Abs Immature Granulocytes: 0.02 10*3/uL (ref 0.00–0.07)
Basophils Absolute: 0.1 10*3/uL (ref 0.0–0.1)
Basophils Relative: 1 %
Eosinophils Absolute: 0.2 10*3/uL (ref 0.0–0.5)
Eosinophils Relative: 3 %
Immature Granulocytes: 0 %
Lymphocytes Relative: 46 %
Lymphs Abs: 3.9 10*3/uL (ref 0.7–4.0)
Monocytes Absolute: 0.5 10*3/uL (ref 0.1–1.0)
Monocytes Relative: 6 %
Neutro Abs: 3.8 10*3/uL (ref 1.7–7.7)
Neutrophils Relative %: 44 %

## 2020-06-19 LAB — SARS CORONAVIRUS 2 BY RT PCR (HOSPITAL ORDER, PERFORMED IN ~~LOC~~ HOSPITAL LAB): SARS Coronavirus 2: NEGATIVE

## 2020-06-19 LAB — PROTIME-INR
INR: 1 (ref 0.8–1.2)
Prothrombin Time: 12.5 seconds (ref 11.4–15.2)

## 2020-06-19 LAB — APTT: aPTT: 28 seconds (ref 24–36)

## 2020-06-19 MED ORDER — SODIUM CHLORIDE 0.9 % IV SOLN: INTRAVENOUS | Status: DC

## 2020-06-19 MED ORDER — STROKE: EARLY STAGES OF RECOVERY BOOK: Freq: Once | Status: DC

## 2020-06-19 MED ORDER — SODIUM CHLORIDE 0.9% FLUSH
3.0000 mL | Freq: Once | INTRAVENOUS | Status: AC
Start: 2020-06-19 — End: 2020-06-19
  Administered 2020-06-19: 3 mL via INTRAVENOUS

## 2020-06-19 MED ORDER — IOHEXOL 350 MG/ML SOLN
100.0000 mL | Freq: Once | INTRAVENOUS | Status: AC | PRN
  Administered 2020-06-19: 100 mL via INTRAVENOUS

## 2020-06-19 MED ORDER — ASPIRIN 300 MG RE SUPP: 300.0000 mg | Freq: Every day | RECTAL | Status: DC

## 2020-06-19 MED ORDER — ASPIRIN 325 MG PO TABS
325.0000 mg | ORAL_TABLET | Freq: Every day | ORAL | Status: DC
  Administered 2020-06-19 – 2020-06-20 (×2): 325 mg via ORAL
  Filled 2020-06-19 (×2): qty 1

## 2020-06-19 MED ORDER — ACETAMINOPHEN 650 MG RE SUPP: 650.0000 mg | RECTAL | Status: DC | PRN

## 2020-06-19 MED ORDER — ACETAMINOPHEN 160 MG/5ML PO SOLN: 650.0000 mg | ORAL | Status: DC | PRN

## 2020-06-19 MED ORDER — ACETAMINOPHEN 325 MG PO TABS: 650.0000 mg | ORAL_TABLET | ORAL | Status: DC | PRN

## 2020-06-19 MED ORDER — ENOXAPARIN SODIUM 40 MG/0.4ML ~~LOC~~ SOLN
40.0000 mg | Freq: Every day | SUBCUTANEOUS | Status: DC
  Administered 2020-06-19 – 2020-06-20 (×2): 40 mg via SUBCUTANEOUS
  Filled 2020-06-19 (×2): qty 0.4

## 2020-06-19 NOTE — ED Triage Notes (Signed)
Pt transported from home by [facility] as code stroke, pt arrives A  & O, pt lives alone and family called 911 concerned pt did not sound like her self. On EMS arrival pt was aphasic then resolved then became aphasic in route once again. #20 R AC. Pt arrives alert LVO neg, with some expressive aphasia noted.  Neuro spoke with family LSN yesterday @ 1600

## 2020-06-19 NOTE — ED Provider Notes (Addendum)
[facility] EMERGENCY DEPARTMENT Provider Note   CSN: [id number] Arrival date & time: 06/19/20  1347     History Chief Complaint  Patient presents with  . Code Stroke    Pamela Hess is a 74 y.o. female.  Patient arrives via EMS as code stroke. Pt was last seen normal yesterday around 4 pm. Today patient seemed confused and/or to have problems/confusion expressing self and finding the right words. Symptoms acute onset, moderate, constant, persistent. Patient limited historian - level 5 caveat. Pt denies headache. Denies change in vision. Denies numbness/weakness. No fever or chills. Denies trauma or fall.   The history is provided by the patient and the EMS personnel. The history is limited by the condition of the patient.       Past Medical History:  Diagnosis Date  . Asthma   . Cancer [facility]) 2014   BREAST CANCER  . Chronic kidney disease   . Diabetes mellitus without complication ([place])   . Full dentures   . History of cerebral aneurysm repair   . Hyperlipidemia   . Hypertension   . Peripheral neuropathy   . Stroke ([place])   . Wears glasses    readers    Patient Active Problem List   Diagnosis Date Noted  . Breast cancer of upper-outer quadrant of right female breast ([place]) 08/31/2013  . 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 GOUT 03/24/2007  . ALCOHOL ABUSE 03/24/2007  . PERIPHERAL NEUROPATHY 03/24/2007  . HYPERTENSION, ESSENTIAL NOS 03/24/2007  . CEREBROVASCULAR DISEASE LATE EFFECTS, NOS 03/24/2007  . COPD 03/24/2007  . Ganglion of joint 03/24/2007  . TB SKIN TEST, POSITIVE, HX OF 03/24/2007  . HYSTERECTOMY, PARTIAL, HX OF 03/24/2007  . CEREBRAL ANEURYSM 01/05/2003    Past Surgical History:  Procedure Laterality Date  . BREAST BIOPSY Right 07/28/2013   Procedure: BREAST BIOPSY WITH NEEDLE LOCALIZATION;  Surgeon: Rolm Bookbinder, MD;  Location: [place];  Service: General;  Laterality: Right;  needle  loc BGC   . CEREBRAL ANEURYSM REPAIR  1999  . GANGLION CYST EXCISION  1968   rt wrist  . MASTECTOMY    . MASTECTOMY W/ SENTINEL NODE BIOPSY Right 08/21/2013   Dr Donne Hazel   . MASTECTOMY W/ SENTINEL NODE BIOPSY Right 08/21/2013   Procedure: MASTECTOMY WITH SENTINEL LYMPH NODE BIOPSY;  Surgeon: Rolm Bookbinder, MD;  Location: [place];  Service: General;  Laterality: Right;     OB History   No obstetric history on file.     Family History  Problem Relation Age of Onset  . Cancer Mother        colon  . Breast cancer Neg Hx     Social History   Tobacco Use  . Smoking status: Current Every Day Smoker    Packs/day: 0.25    Types: Cigarettes  . Smokeless tobacco: Never Used  Substance Use Topics  . Alcohol use: No  . Drug use: No    Home Medications Prior to Admission medications   Medication Sig Start Date End Date Taking? Authorizing Provider  ADVAIR DISKUS 250-50 MCG/DOSE AEPB Inhale 12 puffs into the lungs daily as needed (for shortness of breath).  04/09/13   [provider]  amLODipine (NORVASC) 10 MG tablet Take 10 mg by mouth daily.  06/25/13   [provider]  atorvastatin (LIPITOR) 20 MG tablet Take 20 mg by mouth at bedtime.  07/03/13   [provider]  Cholecalciferol (VITAMIN D) 2000  UNITS tablet Take 2,000 Units by mouth daily.    [provider]  enalapril (VASOTEC) 5 MG tablet Take 5 mg by mouth daily.  07/04/13   [provider]  gabapentin (NEURONTIN) 300 MG capsule Take 300 mg by mouth 3 (three) times daily.  04/27/13   [provider]  glimepiride (AMARYL) 2 MG tablet Take 2 mg by mouth daily with breakfast.  05/05/13   [provider]  Multiple Vitamin (MULTIVITAMIN WITH MINERALS) TABS tablet Take 1 tablet by mouth daily.    [provider]  nortriptyline ([name]) 25 MG capsule Take 25 mg by mouth at bedtime.    [provider]  PROAIR HFA 108 (90 BASE) MCG/ACT inhaler Inhale 1-2  puffs into the lungs every 6 (six) hours as needed for shortness of breath.  05/27/13   [provider]    Allergies    Patient has no known allergies.  Review of Systems   Review of Systems  Constitutional: Negative for fever.  HENT: Negative for trouble swallowing.   Eyes: Negative for visual disturbance.  Respiratory: Negative for shortness of breath.   Cardiovascular: Negative for chest pain.  Gastrointestinal: Negative for abdominal pain and vomiting.  Genitourinary: Negative for flank pain.  Musculoskeletal: Negative for back pain and neck pain.  Skin: Negative for rash.  Neurological: Positive for speech difficulty. Negative for headaches.  Hematological: Does not bruise/bleed easily.  Psychiatric/Behavioral: Positive for confusion.    Physical Exam Updated Vital Signs BP (!) 150/73 (BP Location: Left Arm)   Pulse (!) 53   Temp 98.8 F (37.1 C) (Oral)   Resp 16   Wt 63.6 kg   SpO2 100%   BMI 20.71 kg/m   Physical Exam Vitals and nursing note reviewed.  Constitutional:      Appearance: Normal appearance. She is well-developed.  HENT:     Head: Atraumatic.     Nose: Nose normal.     Mouth/Throat:     Mouth: Mucous membranes are moist.  Eyes:     General: No scleral icterus.    Extraocular Movements: Extraocular movements intact.     Conjunctiva/sclera: Conjunctivae normal.     Pupils: Pupils are equal, round, and reactive to light.  Neck:     Vascular: No carotid bruit.     Trachea: No tracheal deviation.  Cardiovascular:     Rate and Rhythm: Normal rate and regular rhythm.     Pulses: Normal pulses.     Heart sounds: Normal heart sounds. No murmur heard.  No friction rub. No gallop.   Pulmonary:     Effort: Pulmonary effort is normal. No respiratory distress.     Breath sounds: Normal breath sounds.  Abdominal:     General: Bowel sounds are normal. There is no distension.     Palpations: Abdomen is soft.     Tenderness: There is no abdominal  tenderness. There is no guarding.  Genitourinary:    Comments: No cva tenderness.  Musculoskeletal:        General: No swelling or tenderness.     Cervical back: Normal range of motion and neck supple. No rigidity. No muscular tenderness.  Skin:    General: Skin is warm and dry.     Findings: No rash.  Neurological:     Mental Status: She is alert.     Comments: Alert, speech fluent, not grossly dysarthric, however pt with aphasia, word finding difficulty, ?confusion. No pronator drift. Motor intact bil, stre 5/5.  Psychiatric:        Mood and Affect: Mood normal.     ED Results / Procedures / Treatments   Labs (all labs ordered are listed, but only abnormal results are displayed) Results for orders placed or performed during the hospital encounter of 06/19/20  Protime-INR  Result Value Ref Range   Prothrombin Time 12.5 11.4 - 15.2 seconds   INR 1.0 0.8 - 1.2  APTT  Result Value Ref Range   aPTT 28 24 - 36 seconds  CBC  Result Value Ref Range   WBC 8.5 4.0 - 10.5 K/uL   RBC 4.66 3.87 - 5.11 MIL/uL   Hemoglobin 13.8 12.0 - 15.0 g/dL   HCT 43.0 36 - 46 %   MCV 92.3 80.0 - 100.0 fL   MCH 29.6 26.0 - 34.0 pg   MCHC 32.1 30.0 - 36.0 g/dL   RDW 14.1 11.5 - 15.5 %   Platelets 211 150 - 400 K/uL   nRBC 0.0 0.0 - 0.2 %  Differential  Result Value Ref Range   Neutrophils Relative % 44 %   Neutro Abs 3.8 1.7 - 7.7 K/uL   Lymphocytes Relative 46 %   Lymphs Abs 3.9 0.7 - 4.0 K/uL   Monocytes Relative 6 %   Monocytes Absolute 0.5 0 - 1 K/uL   Eosinophils Relative 3 %   Eosinophils Absolute 0.2 0 - 0 K/uL   Basophils Relative 1 %   Basophils Absolute 0.1 0 - 0 K/uL   Immature Granulocytes 0 %   Abs Immature Granulocytes 0.02 0.00 - 0.07 K/uL  Comprehensive metabolic panel  Result Value Ref Range   Sodium 139 135 - 145 mmol/L   Potassium 3.9 3.5 - 5.1 mmol/L   Chloride 106 98 - 111 mmol/L   CO2 20 (L) 22 - 32 mmol/L   Glucose, Bld 119 (H) 70 - 99 mg/dL   BUN 11 8 - 23  mg/dL   Creatinine, Ser 1.45 (H) 0.44 - 1.00 mg/dL   Calcium 9.1 8.9 - 10.3 mg/dL   Total Protein 7.1 6.5 - 8.1 g/dL   Albumin 3.7 3.5 - 5.0 g/dL   AST 16 15 - 41 U/L   ALT 12 0 - 44 U/L   Alkaline Phosphatase 95 38 - 126 U/L   Total Bilirubin 0.6 0.3 - 1.2 mg/dL   GFR calc non Af Amer 36 (L) >60 mL/min   GFR calc Af Amer 41 (L) >60 mL/min   Anion gap 13 5 - 15  I-stat chem 8, ED  Result Value Ref Range   Sodium 141 135 - 145 mmol/L   Potassium 3.8 3.5 - 5.1 mmol/L   Chloride 106 98 - 111 mmol/L   BUN 13 8 - 23 mg/dL   Creatinine, Ser 1.30 (H) 0.44 - 1.00 mg/dL   Glucose, Bld 115 (H) 70 - 99 mg/dL   Calcium, Ion 1.09 (L) 1.15 - 1.40 mmol/L   TCO2 22 22 - 32 mmol/L   Hemoglobin 14.6 12.0 - 15.0 g/dL   HCT 43.0 36 - 46 %   CT Code Stroke CTA Head W/WO contrast  Result Date: 06/19/2020 CLINICAL DATA:  Neuro deficit, acute, stroke suspected. Additional history provided: Aphasia. EXAM: CT ANGIOGRAPHY HEAD AND NECK CT PERFUSION BRAIN TECHNIQUE: Multidetector CT imaging of the head and neck was performed using the standard protocol during bolus administration of intravenous contrast. Multiplanar CT image reconstructions and MIPs were obtained to evaluate the vascular anatomy. Carotid stenosis measurements (when  applicable) are obtained utilizing NASCET criteria, using the distal internal carotid diameter as the denominator. Multiphase CT imaging of the brain was performed following IV bolus contrast injection. Subsequent parametric perfusion maps were calculated using RAPID software. CONTRAST:  Administered contrast not known at this time. COMPARISON:  Noncontrast head CT performed earlier the same day 06/19/2020. FINDINGS: CTA NECK FINDINGS The examination is mildly motion degraded. Aortic arch: Standard aortic branching. Atherosclerotic plaque within the visualized aortic arch and proximal major branch vessels of the neck. Right carotid system: CCA and ICA patent within the neck without  stenosis. Minimal mixed plaque within carotid bifurcation. Left carotid system: See seen ICA patent within the neck without stenosis. Minimal mixed plaque within the carotid bifurcation and proximal ICA. Vertebral arteries: The vertebral arteries are patent within the neck. 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 However, this may be accentuated by vessel tortuosity and motion artifact Skeleton: No acute bony abnormality or aggressive osseous lesion. Cervical spondylosis with multilevel disc space narrowing, posterior disc osteophytes, uncovertebral and facet hypertrophy. Other neck: No neck mass or cervical lymphadenopathy. Multiple thyroid nodules measuring up to 14 mm, not meeting consensus criteria for ultrasound follow-up. Upper chest: No consolidation within the imaged lung apices. No visible pneumothorax. Review of the MIP images confirms the above findings CTA HEAD FINDINGS Anterior circulation: The intracranial internal carotid arteries are patent. A stent is present within the right ICA extending from the pre cavernous to the supraclinoid segment. 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 The M1 middle cerebral arteries are patent without significant stenosis. 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 The anterior cerebral arteries are patent. Posterior circulation: The intracranial vertebral arteries are patent. The basilar artery is patent. The posterior cerebral arteries are patent proximally without significant stenosis. Posterior communicating arteries are hypoplastic or absent bilaterally. Venous sinuses: Within limitations of contrast timing, no convincing thrombus. Anatomic variants: As described Review  of the MIP images confirms the above findings CT Brain Perfusion Findings: There is significant motion degradation, limiting the reliability of the perfusion data. CBF (<30%) Volume: 22mL Perfusion (Tmax>6.0s) volume: 19mL (predominantly within the left frontoparietal lobes) (additional small foci in the region of the right temporal lobe). Mismatch Volume: 80mL Infarction Location:None identified These results were called by telephone at the time of interpretation on 06/19/2020 at 2:39 pm to provider Dr. Rory Percy, who verbally acknowledged these results. IMPRESSION: CTA neck: 1. The bilateral common and internal carotid arteries are patent within the neck without stenosis 2. The vertebral arteries are patent within the neck bilaterally. 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 However, this apparent stenosis may be accentuated by vessel tortuosity and motion artifact. 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 CT perfusion head: There is significant motion degradation which limits the reliability of the perfusion data. The perfusion software identifies a 15 mL region of critically hypoperfused parenchyma utilizing the Tmax>6 seconds threshold. This hypoperfusion is predominantly located within the left frontoparietal lobes. Reported mismatch volume 15 mL. Electronically Signed   By: Kellie Simmering DO   On: 06/19/2020 14:39   CT Code Stroke CTA Neck W/WO contrast  Result Date: 06/19/2020 CLINICAL DATA:  Neuro deficit, acute, stroke suspected. Additional  history provided: Aphasia. EXAM: CT ANGIOGRAPHY HEAD AND NECK CT PERFUSION BRAIN TECHNIQUE: Multidetector CT imaging of the head and neck was performed using the standard protocol during bolus administration of intravenous contrast. Multiplanar CT image reconstructions and MIPs were obtained to evaluate the vascular anatomy. Carotid stenosis measurements (when applicable) are obtained  utilizing NASCET criteria, using the distal internal carotid diameter as the denominator. Multiphase CT imaging of the brain was performed following IV bolus contrast injection. Subsequent parametric perfusion maps were calculated using RAPID software. CONTRAST:  Administered contrast not known at this time. COMPARISON:  Noncontrast head CT performed earlier the same day 06/19/2020. FINDINGS: CTA NECK FINDINGS The examination is mildly motion degraded. Aortic arch: Standard aortic branching. Atherosclerotic plaque within the visualized aortic arch and proximal major branch vessels of the neck. Right carotid system: CCA and ICA patent within the neck without stenosis. Minimal mixed plaque within carotid bifurcation. Left carotid system: See seen ICA patent within the neck without stenosis. Minimal mixed plaque within the carotid bifurcation and proximal ICA. Vertebral arteries: The vertebral arteries are patent within the neck. 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 However, this may be accentuated by vessel tortuosity and motion artifact Skeleton: No acute bony abnormality or aggressive osseous lesion. Cervical spondylosis with multilevel disc space narrowing, posterior disc osteophytes, uncovertebral and facet hypertrophy. Other neck: No neck mass or cervical lymphadenopathy. Multiple thyroid nodules measuring up to 14 mm, not meeting consensus criteria for ultrasound follow-up. Upper chest: No consolidation within the imaged lung apices. No visible pneumothorax. Review of the MIP images confirms the above findings CTA HEAD FINDINGS Anterior circulation: The intracranial internal carotid arteries are patent. A stent is present within the right ICA extending from the pre cavernous to the supraclinoid segment. 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 No appreciable residual/recurrent filling of the treated aneurysm, although streak artifact from the  embolization coil mass does limit evaluation. The M1 middle cerebral arteries are patent without significant stenosis. 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 The anterior cerebral arteries are patent. Posterior circulation: The intracranial vertebral arteries are patent. The basilar artery is patent. The posterior cerebral arteries are patent proximally without significant stenosis. Posterior communicating arteries are hypoplastic or absent bilaterally. Venous sinuses: Within limitations of contrast timing, no convincing thrombus. Anatomic variants: As described Review of the MIP images confirms the above findings CT Brain Perfusion Findings: There is significant motion degradation, limiting the reliability of the perfusion data. CBF (<30%) Volume: 67mL Perfusion (Tmax>6.0s) volume: 99mL (predominantly within the left frontoparietal lobes) (additional small foci in the region of the right temporal lobe). Mismatch Volume: 64mL Infarction Location:None identified These results were called by telephone at the time of interpretation on 06/19/2020 at 2:39 pm to provider Dr. Rory Percy, who verbally acknowledged these results. IMPRESSION: CTA neck: 1. The bilateral common and internal carotid arteries are patent within the neck without stenosis 2. The vertebral arteries are patent within the neck bilaterally. 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 However, this apparent stenosis may be accentuated by vessel tortuosity and motion artifact. 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 CT perfusion head: There is significant motion degradation which limits the reliability of the perfusion data. The perfusion software identifies a 15 mL region of critically hypoperfused parenchyma utilizing the Tmax>6 seconds threshold. This  hypoperfusion is predominantly located  within the left frontoparietal lobes. Reported mismatch volume 15 mL. Electronically Signed   By: Kellie Simmering DO   On: 06/19/2020 14:39   CT Code Stroke Cerebral Perfusion with contrast  Result Date: 06/19/2020 CLINICAL DATA:  Neuro deficit, acute, stroke suspected. Additional history provided: Aphasia. EXAM: CT ANGIOGRAPHY HEAD AND NECK CT PERFUSION BRAIN TECHNIQUE: Multidetector CT imaging of the head and neck was performed using the standard protocol during bolus administration of intravenous contrast. Multiplanar CT image reconstructions and MIPs were obtained to evaluate the vascular anatomy. Carotid stenosis measurements (when applicable) are obtained utilizing NASCET criteria, using the distal internal carotid diameter as the denominator. Multiphase CT imaging of the brain was performed following IV bolus contrast injection. Subsequent parametric perfusion maps were calculated using RAPID software. CONTRAST:  Administered contrast not known at this time. COMPARISON:  Noncontrast head CT performed earlier the same day 06/19/2020. FINDINGS: CTA NECK FINDINGS The examination is mildly motion degraded. Aortic arch: Standard aortic branching. Atherosclerotic plaque within the visualized aortic arch and proximal major branch vessels of the neck. Right carotid system: CCA and ICA patent within the neck without stenosis. Minimal mixed plaque within carotid bifurcation. Left carotid system: See seen ICA patent within the neck without stenosis. Minimal mixed plaque within the carotid bifurcation and proximal ICA. Vertebral arteries: The vertebral arteries are patent within the neck. 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 However, this may be accentuated by vessel tortuosity and motion artifact Skeleton: No acute bony abnormality or aggressive osseous lesion. Cervical spondylosis with multilevel disc space narrowing, posterior disc osteophytes, uncovertebral and facet  hypertrophy. Other neck: No neck mass or cervical lymphadenopathy. Multiple thyroid nodules measuring up to 14 mm, not meeting consensus criteria for ultrasound follow-up. Upper chest: No consolidation within the imaged lung apices. No visible pneumothorax. Review of the MIP images confirms the above findings CTA HEAD FINDINGS Anterior circulation: The intracranial internal carotid arteries are patent. A stent is present within the right ICA extending from the pre cavernous to the supraclinoid segment. 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 The M1 middle cerebral arteries are patent without significant stenosis. 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 The anterior cerebral arteries are patent. Posterior circulation: The intracranial vertebral arteries are patent. The basilar artery is patent. The posterior cerebral arteries are patent proximally without significant stenosis. Posterior communicating arteries are hypoplastic or absent bilaterally. Venous sinuses: Within limitations of contrast timing, no convincing thrombus. Anatomic variants: As described Review of the MIP images confirms the above findings CT Brain Perfusion Findings: There is significant motion degradation, limiting the reliability of the perfusion data. CBF (<30%) Volume: 63mL Perfusion (Tmax>6.0s) volume: 52mL (predominantly within the left frontoparietal lobes) (additional small foci in the region of the right temporal lobe). Mismatch Volume: 53mL Infarction Location:None identified These results were called by telephone at the time of interpretation on 06/19/2020 at 2:39 pm to provider Dr. Rory Percy, who verbally acknowledged these results. IMPRESSION: CTA neck: 1. The bilateral common and  internal carotid arteries are patent within the neck without stenosis 2. The vertebral arteries are patent within the neck bilaterally. 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 However, this apparent stenosis may be accentuated by vessel tortuosity  and motion artifact. 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 CT perfusion head: There is significant motion degradation which limits the reliability of the perfusion data. The perfusion software identifies a 15 mL region of critically hypoperfused parenchyma utilizing the Tmax>6 seconds threshold. This hypoperfusion is predominantly located within the left frontoparietal lobes. Reported mismatch volume 15 mL. Electronically Signed   By: Kellie Simmering DO   On: 06/19/2020 14:39   CT HEAD CODE STROKE WO CONTRAST  Result Date: 06/19/2020 CLINICAL DATA:  Code stroke. Neuro deficit, acute, stroke suspected. EXAM: CT HEAD WITHOUT CONTRAST TECHNIQUE: Contiguous axial images were obtained from the base of the skull through the vertex without intravenous contrast. COMPARISON:  Report from brain MRI and MRA head 09/21/2002 (images unavailable). Head CT 09/21/2002. FINDINGS: Brain: Moderate generalized parenchymal atrophy. There are chronic cortically based infarcts within the right parietal lobe and left temporal occipital lobe. Chronic infarcts within the bilateral basal ganglia. Background moderate ill-defined hypoattenuation within the cerebral white matter which is nonspecific, but consistent with chronic small vessel ischemic disease. Small chronic appearing infarcts within the left cerebellar hemisphere. There is no acute intracranial hemorrhage. No acute demarcated cortical infarct. No extra-axial fluid collection. No evidence of intracranial mass. No midline shift. Vascular: No hyperdense vessel is identified. Right ICA stent. 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 Skull:  Normal. Negative for fracture or focal lesion. Sinuses/Orbits: Visualized orbits show no acute finding. No significant paranasal sinus disease or mastoid effusion at the imaged levels ASPECTS [facility] Stroke Program Early CT Score) - Ganglionic level infarction (caudate, lentiform nuclei, internal capsule, insula, M1-M3 cortex): 7 - Supraganglionic infarction (M4-M6 cortex): 3 Total score (0-10 with 10 being normal): 10 (when discounting chronic infarcts) These results were communicated to Dr. Rory Percy At 2:09 pmon 9/12/2021by text page via the [facility] messaging system. IMPRESSION: No CT evidence of acute intracranial abnormality. ASPECTS is 10 (when discounting chronic infarcts). 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 Chronic lacunar infarcts within the bilateral basal ganglia. Small chronic appearing infarcts within the left cerebellar hemisphere. Background moderate generalized parenchymal atrophy and chronic small vessel ischemic disease. 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 Electronically Signed   By: Kellie Simmering DO   On: 06/19/2020 14:09    EKG EKG Interpretation  Date/Time:  Sunday June 19 2020 14:28:46 EDT Ventricular Rate:  57 PR Interval:    QRS Duration: 91 QT Interval:  471 QTC Calculation: 459 R Axis:   62 Text Interpretation: Sinus rhythm No significant change since last tracing Confirmed by Lajean Saver [phone]) on 06/19/2020 2:46:35 PM   Radiology CT Code Stroke CTA Head W/WO contrast  Result Date: 06/19/2020 CLINICAL DATA:  Neuro deficit, acute, stroke suspected. Additional history provided: Aphasia. EXAM: CT ANGIOGRAPHY HEAD AND NECK CT PERFUSION BRAIN TECHNIQUE: Multidetector CT imaging of the head and neck was performed using the standard protocol during bolus administration of intravenous contrast. Multiplanar CT image reconstructions and MIPs were obtained to evaluate the vascular  anatomy. Carotid stenosis measurements (when applicable) are obtained utilizing NASCET criteria, using the distal internal carotid diameter as the denominator. Multiphase CT imaging of the brain was performed following IV bolus contrast injection. Subsequent parametric perfusion maps were calculated using RAPID software. CONTRAST:  Administered contrast not known at this time. COMPARISON:  Noncontrast head CT performed earlier the same day 06/19/2020. FINDINGS: CTA NECK FINDINGS The examination is mildly motion  degraded. Aortic arch: Standard aortic branching. Atherosclerotic plaque within the visualized aortic arch and proximal major branch vessels of the neck. Right carotid system: CCA and ICA patent within the neck without stenosis. Minimal mixed plaque within carotid bifurcation. Left carotid system: See seen ICA patent within the neck without stenosis. Minimal mixed plaque within the carotid bifurcation and proximal ICA. Vertebral arteries: The vertebral arteries are patent within the neck. 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 However, this may be accentuated by vessel tortuosity and motion artifact Skeleton: No acute bony abnormality or aggressive osseous lesion. Cervical spondylosis with multilevel disc space narrowing, posterior disc osteophytes, uncovertebral and facet hypertrophy. Other neck: No neck mass or cervical lymphadenopathy. Multiple thyroid nodules measuring up to 14 mm, not meeting consensus criteria for ultrasound follow-up. Upper chest: No consolidation within the imaged lung apices. No visible pneumothorax. Review of the MIP images confirms the above findings CTA HEAD FINDINGS Anterior circulation: The intracranial internal carotid arteries are patent. A stent is present within the right ICA extending from the pre cavernous to the supraclinoid segment. 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 No appreciable  residual/recurrent filling of the treated aneurysm, although streak artifact from the embolization coil mass does limit evaluation. The M1 middle cerebral arteries are patent without significant stenosis. 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 The anterior cerebral arteries are patent. Posterior circulation: The intracranial vertebral arteries are patent. The basilar artery is patent. The posterior cerebral arteries are patent proximally without significant stenosis. Posterior communicating arteries are hypoplastic or absent bilaterally. Venous sinuses: Within limitations of contrast timing, no convincing thrombus. Anatomic variants: As described Review of the MIP images confirms the above findings CT Brain Perfusion Findings: There is significant motion degradation, limiting the reliability of the perfusion data. CBF (<30%) Volume: 20mL Perfusion (Tmax>6.0s) volume: 92mL (predominantly within the left frontoparietal lobes) (additional small foci in the region of the right temporal lobe). Mismatch Volume: 57mL Infarction Location:None identified These results were called by telephone at the time of interpretation on 06/19/2020 at 2:39 pm to provider Dr. Rory Percy, who verbally acknowledged these results. IMPRESSION: CTA neck: 1. The bilateral common and internal carotid arteries are patent within the neck without stenosis 2. The vertebral arteries are patent within the neck bilaterally. 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 However, this apparent stenosis may be accentuated by vessel tortuosity and motion artifact. 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 CT perfusion head: There is significant motion degradation which limits the reliability of the perfusion data. The perfusion software identifies a 15 mL region of critically hypoperfused parenchyma  utilizing the Tmax>6 seconds threshold. This hypoperfusion is predominantly located within the left frontoparietal lobes. Reported mismatch volume 15 mL. Electronically Signed   By: Kellie Simmering DO   On: 06/19/2020 14:39   CT Code Stroke CTA Neck W/WO contrast  Result Date: 06/19/2020 CLINICAL DATA:  Neuro deficit, acute, stroke suspected. Additional history provided: Aphasia. EXAM: CT ANGIOGRAPHY HEAD AND NECK CT PERFUSION BRAIN TECHNIQUE: Multidetector CT imaging of the head and neck was performed using the standard protocol during bolus administration of intravenous contrast. Multiplanar CT image reconstructions and MIPs were obtained to evaluate the vascular anatomy. Carotid stenosis measurements (when applicable) are obtained utilizing NASCET criteria, using the distal internal carotid diameter as the denominator. Multiphase  CT imaging of the brain was performed following IV bolus contrast injection. Subsequent parametric perfusion maps were calculated using RAPID software. CONTRAST:  Administered contrast not known at this time. COMPARISON:  Noncontrast head CT performed earlier the same day 06/19/2020. FINDINGS: CTA NECK FINDINGS The examination is mildly motion degraded. Aortic arch: Standard aortic branching. Atherosclerotic plaque within the visualized aortic arch and proximal major branch vessels of the neck. Right carotid system: CCA and ICA patent within the neck without stenosis. Minimal mixed plaque within carotid bifurcation. Left carotid system: See seen ICA patent within the neck without stenosis. Minimal mixed plaque within the carotid bifurcation and proximal ICA. Vertebral arteries: The vertebral arteries are patent within the neck. 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 However, this may be accentuated by vessel tortuosity and motion artifact Skeleton: No acute bony abnormality or aggressive osseous lesion. Cervical spondylosis with multilevel disc  space narrowing, posterior disc osteophytes, uncovertebral and facet hypertrophy. Other neck: No neck mass or cervical lymphadenopathy. Multiple thyroid nodules measuring up to 14 mm, not meeting consensus criteria for ultrasound follow-up. Upper chest: No consolidation within the imaged lung apices. No visible pneumothorax. Review of the MIP images confirms the above findings CTA HEAD FINDINGS Anterior circulation: The intracranial internal carotid arteries are patent. A stent is present within the right ICA extending from the pre cavernous to the supraclinoid segment. 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 The M1 middle cerebral arteries are patent without significant stenosis. 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 The anterior cerebral arteries are patent. Posterior circulation: The intracranial vertebral arteries are patent. The basilar artery is patent. The posterior cerebral arteries are patent proximally without significant stenosis. Posterior communicating arteries are hypoplastic or absent bilaterally. Venous sinuses: Within limitations of contrast timing, no convincing thrombus. Anatomic variants: As described Review of the MIP images confirms the above findings CT Brain Perfusion Findings: There is significant motion degradation, limiting the reliability of the perfusion data. CBF (<30%) Volume: 78mL Perfusion (Tmax>6.0s) volume: 43mL (predominantly within the left frontoparietal lobes) (additional small foci in the region of the right temporal lobe). Mismatch Volume: 36mL Infarction Location:None identified These results were called by telephone at the time of interpretation on 06/19/2020 at 2:39 pm to provider Dr. Rory Percy, who verbally  acknowledged these results. IMPRESSION: CTA neck: 1. The bilateral common and internal carotid arteries are patent within the neck without stenosis 2. The vertebral arteries are patent within the neck bilaterally. 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 However, this apparent stenosis may be accentuated by vessel tortuosity and motion artifact. 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 CT perfusion head: There is significant motion degradation which limits the reliability of the perfusion data. The perfusion software identifies a 15 mL region of critically hypoperfused parenchyma utilizing the Tmax>6 seconds threshold. This hypoperfusion is predominantly located within the left frontoparietal lobes. Reported mismatch volume 15 mL. Electronically Signed   By: Kellie Simmering DO   On: 06/19/2020 14:39   CT Code Stroke Cerebral Perfusion with contrast  Result Date: 06/19/2020 CLINICAL DATA:  Neuro deficit, acute, stroke suspected. Additional history provided: Aphasia. EXAM: CT ANGIOGRAPHY HEAD AND NECK CT PERFUSION BRAIN TECHNIQUE: Multidetector CT imaging  of the head and neck was performed using the standard protocol during bolus administration of intravenous contrast. Multiplanar CT image reconstructions and MIPs were obtained to evaluate the vascular anatomy. Carotid stenosis measurements (when applicable) are obtained utilizing NASCET criteria, using the distal internal carotid diameter as the denominator. Multiphase CT imaging of the brain was performed following IV bolus contrast injection. Subsequent parametric perfusion maps were calculated using RAPID software. CONTRAST:  Administered contrast not known at this time. COMPARISON:  Noncontrast head CT performed earlier the same day 06/19/2020. FINDINGS: CTA NECK FINDINGS The examination is mildly motion degraded. Aortic arch: Standard aortic branching. Atherosclerotic plaque within  the visualized aortic arch and proximal major branch vessels of the neck. Right carotid system: CCA and ICA patent within the neck without stenosis. Minimal mixed plaque within carotid bifurcation. Left carotid system: See seen ICA patent within the neck without stenosis. Minimal mixed plaque within the carotid bifurcation and proximal ICA. Vertebral arteries: The vertebral arteries are patent within the neck. 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 However, this may be accentuated by vessel tortuosity and motion artifact Skeleton: No acute bony abnormality or aggressive osseous lesion. Cervical spondylosis with multilevel disc space narrowing, posterior disc osteophytes, uncovertebral and facet hypertrophy. Other neck: No neck mass or cervical lymphadenopathy. Multiple thyroid nodules measuring up to 14 mm, not meeting consensus criteria for ultrasound follow-up. Upper chest: No consolidation within the imaged lung apices. No visible pneumothorax. Review of the MIP images confirms the above findings CTA HEAD FINDINGS Anterior circulation: The intracranial internal carotid arteries are patent. A stent is present within the right ICA extending from the pre cavernous to the supraclinoid segment. 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 The M1 middle cerebral arteries are patent without significant stenosis. 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 The anterior cerebral arteries are patent. Posterior circulation: The intracranial vertebral arteries are patent. The basilar artery is patent. The posterior cerebral arteries are patent proximally without significant stenosis. Posterior communicating arteries are hypoplastic  or absent bilaterally. Venous sinuses: Within limitations of contrast timing, no convincing thrombus. Anatomic variants: As described Review of the MIP images confirms the above findings CT Brain Perfusion Findings: There is significant motion degradation, limiting the reliability of the perfusion data. CBF (<30%) Volume: 41mL Perfusion (Tmax>6.0s) volume: 77mL (predominantly within the left frontoparietal lobes) (additional small foci in the region of the right temporal lobe). Mismatch Volume: 48mL Infarction Location:None identified These results were called by telephone at the time of interpretation on 06/19/2020 at 2:39 pm to provider Dr. Rory Percy, who verbally acknowledged these results. IMPRESSION: CTA neck: 1. The bilateral common and internal carotid arteries are patent within the neck without stenosis 2. The vertebral arteries are patent within the neck bilaterally. 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 However, this apparent stenosis may be accentuated by vessel tortuosity and motion artifact. 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 CT perfusion head: There is significant motion degradation which limits the reliability of the perfusion data. The perfusion software identifies a 15 mL region of critically hypoperfused parenchyma utilizing the Tmax>6 seconds threshold. This hypoperfusion is predominantly located within the left frontoparietal lobes. Reported mismatch volume 15 mL. Electronically Signed  By: Kellie Simmering DO   On: 06/19/2020 14:39   CT HEAD CODE STROKE WO CONTRAST  Result Date: 06/19/2020 CLINICAL DATA:  Code stroke. Neuro deficit, acute, stroke suspected. EXAM: CT HEAD WITHOUT CONTRAST TECHNIQUE: Contiguous axial images were obtained from the base of the skull through the vertex without intravenous contrast. COMPARISON:  Report from brain MRI and MRA head 09/21/2002 (images unavailable). Head CT 09/21/2002.  FINDINGS: Brain: Moderate generalized parenchymal atrophy. There are chronic cortically based infarcts within the right parietal lobe and left temporal occipital lobe. Chronic infarcts within the bilateral basal ganglia. Background moderate ill-defined hypoattenuation within the cerebral white matter which is nonspecific, but consistent with chronic small vessel ischemic disease. Small chronic appearing infarcts within the left cerebellar hemisphere. There is no acute intracranial hemorrhage. No acute demarcated cortical infarct. No extra-axial fluid collection. No evidence of intracranial mass. No midline shift. Vascular: No hyperdense vessel is identified. Right ICA stent. 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 Skull: Normal. Negative for fracture or focal lesion. Sinuses/Orbits: Visualized orbits show no acute finding. No significant paranasal sinus disease or mastoid effusion at the imaged levels ASPECTS [facility] Stroke Program Early CT Score) - Ganglionic level infarction (caudate, lentiform nuclei, internal capsule, insula, M1-M3 cortex): 7 - Supraganglionic infarction (M4-M6 cortex): 3 Total score (0-10 with 10 being normal): 10 (when discounting chronic infarcts) These results were communicated to Dr. Rory Percy At 2:09 pmon 9/12/2021by text page via the [facility] messaging system. IMPRESSION: No CT evidence of acute intracranial abnormality. ASPECTS is 10 (when discounting chronic infarcts). 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 Chronic lacunar infarcts within the bilateral basal ganglia. Small chronic appearing infarcts within the left cerebellar hemisphere. Background moderate generalized parenchymal atrophy and chronic small vessel ischemic disease. 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 Electronically Signed   By: Kellie Simmering DO   On: 06/19/2020 14:09    Procedures Procedures (including critical care  time)  Medications Ordered in ED Medications  sodium chloride flush (NS) 0.9 % injection 3 mL (has no administration in time range)  iohexol (OMNIPAQUE) 350 MG/ML injection 100 mL (100 mLs Intravenous Contrast Given 06/19/20 1423)    ED Course  I have reviewed the triage vital signs and the nursing notes.  Pertinent labs & imaging results that were available during my care of the patient were reviewed by me and considered in my medical decision making (see chart for details).    MDM Rules/Calculators/A&P                         Iv ns. Continuous pulse ox and monitor. Stat labs and imaging.   Code stroke activation by EMS. Neurology emergently consulted and evaluated in ED.   Reviewed nursing notes and prior charts for additional history.   MDM Number of Diagnoses or Management Options   Amount and/or Complexity of Data Reviewed Clinical lab tests: ordered and reviewed Tests in the radiology section of CPT: ordered and reviewed Tests in the medicine section of CPT: ordered and reviewed Discussion of test results with the performing providers: yes Decide to obtain previous medical records or to obtain history from someone other than the patient: yes Obtain history from someone other than the patient: yes Review and summarize past medical records: yes Discuss the patient with other providers: yes Independent visualization of images, tracings, or specimens: yes  Risk of Complications, Morbidity, and/or Mortality Presenting  problems: high Diagnostic procedures: high Management options: high   CT reviewed/interpreted by me - no hem.   CTA imaging - small distal perfusion defect/acute cva. Neurology indicates due to location cva, and time of onset symptoms, and mild deficit, that pt is not candidate for tpa or other intervention - they recommend admission to hospitalist service for stroke workup, and they will follow as consult.   Recheck pt, no change from initial.  Labs  reviewed/interpreted by me - chem unremarkable. Wbc normal.   Hospitalists consulted for admission.   Final Clinical Impression(s) / ED Diagnoses Final diagnoses:  None    Rx / DC Orders ED Discharge Orders    None           Lajean Saver, MD 06/19/20 1512

## 2020-06-19 NOTE — ED Notes (Signed)
Echo at bedside

## 2020-06-19 NOTE — Progress Notes (Signed)
  Echocardiogram 2D Echocardiogram has been performed.  Johny Chess 06/19/2020, 5:37 PM

## 2020-06-19 NOTE — Consult Note (Addendum)
NEURO HOSPITALIST  CONSULT   Requesting Physician: Dr. Ashok Cordia    Chief Complaint:  aphasia  History obtained from:  Patient  / family  HPI:                                                                                                                                         Pamela Hess is an 74 y.o. female with PMH HTN, HLD, cerebral aneurysm, DM CKD,  Breast cancer (2014) who presented as a code stroke for aphasia.  Patient was at home and daughter came to visit. She could not state her daughters name and kept calling her another person. EMS was called. Initially she seemed normal, but per EMS during the assessment she began having word finding difficulty.  ED course:  CTH: no hemorrhage BP 144/82 BG: 104 CTA: Abrupt occlusion of an inferior division mid M2 left MCA branch Vessel. CTP: penumbra 81ml. No core  Date last known well: 06/18/20 Time last known well: 1600 tPA Given: no; outside of window Modified Rankin: Rankin Score=2 NIHSS:3 (questions, aphasia)   Past Medical History:  Diagnosis Date   Asthma    Cancer ([place]) 2014   BREAST CANCER   Chronic kidney disease    Diabetes mellitus without complication ([place])    Full dentures    History of cerebral aneurysm repair    Hyperlipidemia    Hypertension    Peripheral neuropathy    Stroke [facility])    Wears glasses    readers    Past Surgical History:  Procedure Laterality Date   BREAST BIOPSY Right 07/28/2013   Procedure: BREAST BIOPSY WITH NEEDLE LOCALIZATION;  Surgeon: Rolm Bookbinder, MD;  Location: [place];  Service: General;  Laterality: Right;  needle loc BGC    CEREBRAL ANEURYSM REPAIR  1999   GANGLION CYST EXCISION  1968   rt wrist   MASTECTOMY     MASTECTOMY W/ SENTINEL NODE BIOPSY Right 08/21/2013   Dr Donne Hazel    MASTECTOMY W/ SENTINEL NODE BIOPSY Right 08/21/2013   Procedure: MASTECTOMY WITH SENTINEL LYMPH NODE BIOPSY;  Surgeon:  Rolm Bookbinder, MD;  Location: [place];  Service: General;  Laterality: Right;    Family History  Problem Relation Age of Onset   Cancer Mother        colon   Breast cancer Neg Hx         Social History:  reports that she has been smoking cigarettes. She has been smoking about 0.25 packs per day. She has never used smokeless tobacco. She reports that she does not drink alcohol and does not use drugs.  Allergies: No Known Allergies  Medications:                                                                                                                           Current Facility-Administered Medications  Medication Dose Route Frequency Provider Last Rate Last Admin   sodium chloride flush (NS) 0.9 % injection 3 mL  3 mL Intravenous Once Lajean Saver, MD       Current Outpatient Medications  Medication Sig Dispense Refill   ADVAIR DISKUS 250-50 MCG/DOSE AEPB Inhale 12 puffs into the lungs daily as needed (for shortness of breath).      amLODipine (NORVASC) 10 MG tablet Take 10 mg by mouth daily.      atorvastatin (LIPITOR) 20 MG tablet Take 20 mg by mouth at bedtime.      Cholecalciferol (VITAMIN D) 2000 UNITS tablet Take 2,000 Units by mouth daily.     enalapril (VASOTEC) 5 MG tablet Take 5 mg by mouth daily.      gabapentin (NEURONTIN) 300 MG capsule Take 300 mg by mouth 3 (three) times daily.      glimepiride (AMARYL) 2 MG tablet Take 2 mg by mouth daily with breakfast.      Multiple Vitamin (MULTIVITAMIN WITH MINERALS) TABS tablet Take 1 tablet by mouth daily.     nortriptyline ([name]) 25 MG capsule Take 25 mg by mouth at bedtime.     PROAIR HFA 108 (90 BASE) MCG/ACT inhaler Inhale 1-2 puffs into the lungs every 6 (six) hours as needed for shortness of breath.     '   ROS:                                                                                                                                       ROS was performed and is negative except as noted in  HPI    General Examination:  Blood pressure (!) 143/70, pulse (!) 50, temperature 98.8 F (37.1 C), temperature source Oral, resp. rate 18, weight 63.6 kg, SpO2 98 %.  Per attending physician  Lab Results: Basic Metabolic Panel: Recent Labs  Lab 06/19/20 1353  NA 141  K 3.8  CL 106  GLUCOSE 115*  BUN 13  CREATININE 1.30*    CBC: Recent Labs  Lab 06/19/20 1351 06/19/20 1353  WBC 8.5  --   NEUTROABS 3.8  --   HGB 13.8 14.6  HCT 43.0 43.0  MCV 92.3  --   PLT 211  --     CBG: No results for input(s): GLUCAP in the last 168 hours.  Imaging: CT Code Stroke CTA Head W/WO contrast  Result Date: 06/19/2020 CLINICAL DATA:  Neuro deficit, acute, stroke suspected. Additional history provided: Aphasia. EXAM: CT ANGIOGRAPHY HEAD AND NECK CT PERFUSION BRAIN TECHNIQUE: Multidetector CT imaging of the head and neck was performed using the standard protocol during bolus administration of intravenous contrast. Multiplanar CT image reconstructions and MIPs were obtained to evaluate the vascular anatomy. Carotid stenosis measurements (when applicable) are obtained utilizing NASCET criteria, using the distal internal carotid diameter as the denominator. Multiphase CT imaging of the brain was performed following IV bolus contrast injection. Subsequent parametric perfusion maps were calculated using RAPID software. CONTRAST:  Administered contrast not known at this time. COMPARISON:  Noncontrast head CT performed earlier the same day 06/19/2020. FINDINGS: CTA NECK FINDINGS The examination is mildly motion degraded. Aortic arch: Standard aortic branching. Atherosclerotic plaque within the visualized aortic arch and proximal major branch vessels of the neck. Right carotid system: CCA and ICA patent within the neck without stenosis. Minimal mixed plaque within carotid bifurcation. Left carotid system:  See seen ICA patent within the neck without stenosis. Minimal mixed plaque within the carotid bifurcation and proximal ICA. Vertebral arteries: The vertebral arteries are patent within the neck. 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 However, this may be accentuated by vessel tortuosity and motion artifact Skeleton: No acute bony abnormality or aggressive osseous lesion. Cervical spondylosis with multilevel disc space narrowing, posterior disc osteophytes, uncovertebral and facet hypertrophy. Other neck: No neck mass or cervical lymphadenopathy. Multiple thyroid nodules measuring up to 14 mm, not meeting consensus criteria for ultrasound follow-up. Upper chest: No consolidation within the imaged lung apices. No visible pneumothorax. Review of the MIP images confirms the above findings CTA HEAD FINDINGS Anterior circulation: The intracranial internal carotid arteries are patent. A stent is present within the right ICA extending from the pre cavernous to the supraclinoid segment. 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 The M1 middle cerebral arteries are patent without significant stenosis. 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 The anterior cerebral arteries are patent. Posterior circulation: The intracranial vertebral arteries are patent. The basilar artery is patent. The posterior cerebral arteries are patent proximally without significant stenosis. Posterior communicating arteries are hypoplastic or absent bilaterally. Venous sinuses: Within limitations of contrast timing, no convincing thrombus. Anatomic variants: As described Review of the MIP images confirms the above findings CT Brain Perfusion Findings:  There is significant motion degradation, limiting the reliability of the perfusion data. CBF (<30%) Volume: 72mL Perfusion (Tmax>6.0s) volume: 52mL (predominantly within the left frontoparietal lobes) (additional small foci  in the region of the right temporal lobe). Mismatch Volume: 76mL Infarction Location:None identified These results were called by telephone at the time of interpretation on 06/19/2020 at 2:39 pm to provider Dr. Rory Percy, who verbally acknowledged these results. IMPRESSION: CTA neck: 1. The bilateral common and internal carotid arteries are patent within the neck without stenosis 2. The vertebral arteries are patent within the neck bilaterally. 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 However, this apparent stenosis may be accentuated by vessel tortuosity and motion artifact. 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 CT perfusion head: There is significant motion degradation which limits the reliability of the perfusion data. The perfusion software identifies a 15 mL region of critically hypoperfused parenchyma utilizing the Tmax>6 seconds threshold. This hypoperfusion is predominantly located within the left frontoparietal lobes. Reported mismatch volume 15 mL. Electronically Signed   By: Kellie Simmering DO   On: 06/19/2020 14:39   CT Code Stroke CTA Neck W/WO contrast  Result Date: 06/19/2020 CLINICAL DATA:  Neuro deficit, acute, stroke suspected. Additional history provided: Aphasia. EXAM: CT ANGIOGRAPHY HEAD AND NECK CT PERFUSION BRAIN TECHNIQUE: Multidetector CT imaging of the head and neck was performed using the standard protocol during bolus administration of intravenous contrast. Multiplanar CT image reconstructions and MIPs were obtained to evaluate the vascular anatomy. Carotid stenosis measurements (when applicable) are obtained utilizing NASCET criteria, using the distal internal carotid diameter as the  denominator. Multiphase CT imaging of the brain was performed following IV bolus contrast injection. Subsequent parametric perfusion maps were calculated using RAPID software. CONTRAST:  Administered contrast not known at this time. COMPARISON:  Noncontrast head CT performed earlier the same day 06/19/2020. FINDINGS: CTA NECK FINDINGS The examination is mildly motion degraded. Aortic arch: Standard aortic branching. Atherosclerotic plaque within the visualized aortic arch and proximal major branch vessels of the neck. Right carotid system: CCA and ICA patent within the neck without stenosis. Minimal mixed plaque within carotid bifurcation. Left carotid system: See seen ICA patent within the neck without stenosis. Minimal mixed plaque within the carotid bifurcation and proximal ICA. Vertebral arteries: The vertebral arteries are patent within the neck. 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 However, this may be accentuated by vessel tortuosity and motion artifact Skeleton: No acute bony abnormality or aggressive osseous lesion. Cervical spondylosis with multilevel disc space narrowing, posterior disc osteophytes, uncovertebral and facet hypertrophy. Other neck: No neck mass or cervical lymphadenopathy. Multiple thyroid nodules measuring up to 14 mm, not meeting consensus criteria for ultrasound follow-up. Upper chest: No consolidation within the imaged lung apices. No visible pneumothorax. Review of the MIP images confirms the above findings CTA HEAD FINDINGS Anterior circulation: The intracranial internal carotid arteries are patent. A stent is present within the right ICA extending from the pre cavernous to the supraclinoid segment. 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 The M1 middle cerebral arteries  are patent without significant stenosis. 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 The anterior cerebral arteries are patent. Posterior circulation: The intracranial vertebral arteries are patent. The basilar artery is patent. The posterior cerebral arteries are patent proximally without significant stenosis. Posterior communicating arteries are hypoplastic or absent bilaterally. Venous sinuses: Within limitations of contrast  timing, no convincing thrombus. Anatomic variants: As described Review of the MIP images confirms the above findings CT Brain Perfusion Findings: There is significant motion degradation, limiting the reliability of the perfusion data. CBF (<30%) Volume: 39mL Perfusion (Tmax>6.0s) volume: 57mL (predominantly within the left frontoparietal lobes) (additional small foci in the region of the right temporal lobe). Mismatch Volume: 50mL Infarction Location:None identified These results were called by telephone at the time of interpretation on 06/19/2020 at 2:39 pm to provider Dr. Rory Percy, who verbally acknowledged these results. IMPRESSION: CTA neck: 1. The bilateral common and internal carotid arteries are patent within the neck without stenosis 2. The vertebral arteries are patent within the neck bilaterally. 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 However, this apparent stenosis may be accentuated by vessel tortuosity and motion artifact. 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 CT perfusion head: There is significant motion degradation which limits the reliability of the perfusion data. The perfusion software identifies a 15 mL region of critically hypoperfused parenchyma utilizing the Tmax>6 seconds threshold. This hypoperfusion is predominantly located within the left frontoparietal lobes. Reported mismatch volume 15 mL.  Electronically Signed   By: Kellie Simmering DO   On: 06/19/2020 14:39   CT Code Stroke Cerebral Perfusion with contrast  Result Date: 06/19/2020 CLINICAL DATA:  Neuro deficit, acute, stroke suspected. Additional history provided: Aphasia. EXAM: CT ANGIOGRAPHY HEAD AND NECK CT PERFUSION BRAIN TECHNIQUE: Multidetector CT imaging of the head and neck was performed using the standard protocol during bolus administration of intravenous contrast. Multiplanar CT image reconstructions and MIPs were obtained to evaluate the vascular anatomy. Carotid stenosis measurements (when applicable) are obtained utilizing NASCET criteria, using the distal internal carotid diameter as the denominator. Multiphase CT imaging of the brain was performed following IV bolus contrast injection. Subsequent parametric perfusion maps were calculated using RAPID software. CONTRAST:  Administered contrast not known at this time. COMPARISON:  Noncontrast head CT performed earlier the same day 06/19/2020. FINDINGS: CTA NECK FINDINGS The examination is mildly motion degraded. Aortic arch: Standard aortic branching. Atherosclerotic plaque within the visualized aortic arch and proximal major branch vessels of the neck. Right carotid system: CCA and ICA patent within the neck without stenosis. Minimal mixed plaque within carotid bifurcation. Left carotid system: See seen ICA patent within the neck without stenosis. Minimal mixed plaque within the carotid bifurcation and proximal ICA. Vertebral arteries: The vertebral arteries are patent within the neck. 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 However, this may be accentuated by vessel tortuosity and motion artifact Skeleton: No acute bony abnormality or aggressive osseous lesion. Cervical spondylosis with multilevel disc space narrowing, posterior disc osteophytes, uncovertebral and facet hypertrophy. Other neck: No neck mass or cervical lymphadenopathy. Multiple  thyroid nodules measuring up to 14 mm, not meeting consensus criteria for ultrasound follow-up. Upper chest: No consolidation within the imaged lung apices. No visible pneumothorax. Review of the MIP images confirms the above findings CTA HEAD FINDINGS Anterior circulation: The intracranial internal carotid arteries are patent. A stent is present within the right ICA extending from the pre cavernous to the supraclinoid segment. 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 The M1 middle cerebral arteries are patent without significant stenosis. There is abrupt occlusion of an inferior division mid M2 left MCA branch vessel (series 13,  image 29) (series 8, images 90 1-89). The anterior cerebral arteries are patent. Posterior circulation: The intracranial vertebral arteries are patent. The basilar artery is patent. The posterior cerebral arteries are patent proximally without significant stenosis. Posterior communicating arteries are hypoplastic or absent bilaterally. Venous sinuses: Within limitations of contrast timing, no convincing thrombus. Anatomic variants: As described Review of the MIP images confirms the above findings CT Brain Perfusion Findings: There is significant motion degradation, limiting the reliability of the perfusion data. CBF (<30%) Volume: 55mL Perfusion (Tmax>6.0s) volume: 85mL (predominantly within the left frontoparietal lobes) (additional small foci in the region of the right temporal lobe). Mismatch Volume: 31mL Infarction Location:None identified These results were called by telephone at the time of interpretation on 06/19/2020 at 2:39 pm to provider Dr. Rory Percy, who verbally acknowledged these results. IMPRESSION: CTA neck: 1. The bilateral common and internal carotid arteries are patent within the neck without stenosis 2. The  vertebral arteries are patent within the neck bilaterally. 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 However, this apparent stenosis may be accentuated by vessel tortuosity and motion artifact. 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 CT perfusion head: There is significant motion degradation which limits the reliability of the perfusion data. The perfusion software identifies a 15 mL region of critically hypoperfused parenchyma utilizing the Tmax>6 seconds threshold. This hypoperfusion is predominantly located within the left frontoparietal lobes. Reported mismatch volume 15 mL. Electronically Signed   By: Kellie Simmering DO   On: 06/19/2020 14:39   CT HEAD CODE STROKE WO CONTRAST  Result Date: 06/19/2020 CLINICAL DATA:  Code stroke. Neuro deficit, acute, stroke suspected. EXAM: CT HEAD WITHOUT CONTRAST TECHNIQUE: Contiguous axial images were obtained from the base of the skull through the vertex without intravenous contrast. COMPARISON:  Report from brain MRI and MRA head 09/21/2002 (images unavailable). Head CT 09/21/2002. FINDINGS: Brain: Moderate generalized parenchymal atrophy. There are chronic cortically based infarcts within the right parietal lobe and left temporal occipital lobe. Chronic infarcts within the bilateral basal ganglia. Background moderate ill-defined hypoattenuation within the cerebral white matter which is nonspecific, but consistent with chronic small vessel ischemic disease. Small chronic appearing infarcts within the left cerebellar hemisphere. There is no acute intracranial hemorrhage. No acute demarcated cortical infarct. No extra-axial fluid collection. No evidence of intracranial mass. No midline shift. Vascular: No hyperdense vessel is identified. Right ICA stent. 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 Skull: Normal. Negative for fracture or focal lesion. Sinuses/Orbits: Visualized  orbits show no acute finding. No significant paranasal sinus disease or mastoid effusion at the imaged levels ASPECTS [facility] Stroke Program Early CT Score) - Ganglionic level infarction (caudate, lentiform nuclei, internal capsule, insula, M1-M3 cortex): 7 - Supraganglionic infarction (M4-M6 cortex): 3 Total score (0-10 with 10 being normal): 10 (when discounting chronic infarcts) These results were communicated to Dr. Rory Percy At 2:09 pmon 9/12/2021by text page via the [facility] messaging system. IMPRESSION: No CT evidence of acute intracranial abnormality. ASPECTS is 10 (when discounting chronic infarcts). 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 Chronic lacunar infarcts within the bilateral basal ganglia. Small chronic appearing infarcts within the left cerebellar hemisphere. Background moderate generalized parenchymal atrophy and chronic small vessel ischemic disease. 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 Electronically Signed   By: Kellie Simmering DO   On: 06/19/2020 14:09   Laurey Morale, MSN, NP-C [facility] [phone]  06/19/2020, 1:53 PM   Attending physician note to follow with Assessment and plan .   Assessment: 74 y.o. female with  PMH HTN, HLD, cerebral aneurysm, DM CKD,  Breast cancer (2014) who presented as a code stroke for aphasia. The [place] did not show a hemorrhage. The CTA showed a left mid M2 occlusion. No core infarct. Patient is not a candidate for IR d/t occlusion being too distal. Complete stroke work-up needed.  Stroke Risk Factors - hyperlipidemia and hypertension    Recommendations: -- BP goal : Permissive HTN upto 220/110 mmHg (for 24-48 post admission ) goal is to normalize BP < 140/90 by discharge.  --MRI Brain  --Echocardiogram -- ASA -- High intensity Statin if LDL > 70 -- HgbA1c, fasting lipid panel -- PT consult, OT consult, Speech consult --Telemetry  monitoring --Frequent neuro checks --Stroke swallow screen    --Please page the Stroke team from 8am-4pm.   You can look them up on [email or web address]

## 2020-06-19 NOTE — Progress Notes (Signed)
Per Radiologist, Dr. Armandina Gemma, we need more information on cerebral stent and embolization material in order to scan pt. Without this information we are unable to proceed with scan at this time. Message sent to ordering and nurse.

## 2020-06-19 NOTE — H&P (Addendum)
History and Physical    Pamela Hess [place] BPZ:[id number] DOB: 01-01-1946 DOA: 06/19/2020  PCP: Nolene Ebbs, MD  Patient coming from: home I have personally briefly reviewed patient's old medical records in [place]  Chief Complaint: aphasia  HPI: Pamela Hess is a 74 y.o. female with medical history significant of hypertension, hyperlipidemia, CKD stage III, right breast cancer status post mastectomy, tobacco abuse, mild intermittent asthma presents to emergency department for evaluation of aphasia.  Apparently patient was at home this morning and daughter came to visit her and found that patient was not behaving like herself, she had confusion expressing self and finding the words.  upon my evaluation: Patient tells me that she is not sure why she is in the ER and does not know what is wrong with her.  She denies any symptoms including headache, blurry vision, lightheadedness, dizziness, loss of consciousness, chest pain, shortness of breath, palpitation, numbness weakness tingling sensation in hands or feet, palpitation, leg swelling, fever, chills, nausea, vomiting, abdominal pain, urinary or bowel changes.  She lives alone at home.  Independent on daily life activities.  Uses cane for ambulation at baseline.  Smokes 1 to 2 cigarettes/day however denies alcohol, licit drug use.  ED Course: Upon arrival to ED: Patient's heart rate noted to be in 50s, blood pressure: 150/73, afebrile, maintaining oxygen saturation on room air, initial labs including CBC, PT/INR: WNL, CMP shows worsening kidney function.  COVID-19 pending.  CT head negative for acute findings.  CTA head and neck and perfusion head: Shows abrupt occlusion of an inferior division of a.m. to left MCA branch vessel.  Neurology consulted who recommended stroke work-up.  [facility] hospitalist consulted for admission for full stroke work-up.  Review of Systems: As per HPI otherwise negative.    Past Medical History:   Diagnosis Date  . Asthma   . Cancer [facility]) 2014   BREAST CANCER  . Chronic kidney disease   . Diabetes mellitus without complication ([place])   . Full dentures   . History of cerebral aneurysm repair   . Hyperlipidemia   . Hypertension   . Peripheral neuropathy   . Stroke ([place])   . Wears glasses    readers    Past Surgical History:  Procedure Laterality Date  . BREAST BIOPSY Right 07/28/2013   Procedure: BREAST BIOPSY WITH NEEDLE LOCALIZATION;  Surgeon: Rolm Bookbinder, MD;  Location: [place];  Service: General;  Laterality: Right;  needle loc BGC   . CEREBRAL ANEURYSM REPAIR  1999  . GANGLION CYST EXCISION  1968   rt wrist  . MASTECTOMY    . MASTECTOMY W/ SENTINEL NODE BIOPSY Right 08/21/2013   Dr Donne Hazel   . MASTECTOMY W/ SENTINEL NODE BIOPSY Right 08/21/2013   Procedure: MASTECTOMY WITH SENTINEL LYMPH NODE BIOPSY;  Surgeon: Rolm Bookbinder, MD;  Location: [place];  Service: General;  Laterality: Right;     reports that she has been smoking cigarettes. She has been smoking about 0.25 packs per day. She has never used smokeless tobacco. She reports that she does not drink alcohol and does not use drugs.  No Known Allergies  Family History  Problem Relation Age of Onset  . Cancer Mother        colon  . Breast cancer Neg Hx     Prior to Admission medications   Medication Sig Start Date End Date Taking? Authorizing Provider  ADVAIR DISKUS 250-50 MCG/DOSE AEPB Inhale 12 puffs into the lungs daily as  needed (for shortness of breath).  04/09/13   [provider]  amLODipine (NORVASC) 10 MG tablet Take 10 mg by mouth daily.  06/25/13   [provider]  atorvastatin (LIPITOR) 20 MG tablet Take 20 mg by mouth at bedtime.  07/03/13   [provider]  Cholecalciferol (VITAMIN D) 2000 UNITS tablet Take 2,000 Units by mouth daily.    [provider]  enalapril (VASOTEC) 5 MG tablet Take 5 mg by mouth daily.  07/04/13   [provider]  gabapentin (NEURONTIN) 300 MG capsule Take 300 mg by mouth 3 (three) times daily.  04/27/13   [provider]  glimepiride (AMARYL) 2 MG tablet Take 2 mg by mouth daily with breakfast.  05/05/13   [provider]  Multiple Vitamin (MULTIVITAMIN WITH MINERALS) TABS tablet Take 1 tablet by mouth daily.    [provider]  nortriptyline ([name]) 25 MG capsule Take 25 mg by mouth at bedtime.    [provider]  PROAIR HFA 108 (90 BASE) MCG/ACT inhaler Inhale 1-2 puffs into the lungs every 6 (six) hours as needed for shortness of breath.  05/27/13   [provider]    Physical Exam: Vitals:   06/19/20 1347 06/19/20 1414 06/19/20 1500  BP: (!) 150/73  (!) 143/70  Pulse: (!) 53  (!) 50  Resp: 16  18  Temp: 98.8 F (37.1 C)    TempSrc: Oral    SpO2: 100%  98%  Weight:  63.6 kg     Constitutional: NAD, calm, comfortable, on room air Eyes: PERRL, lids and conjunctivae normal ENMT: Mucous membranes are moist. Posterior pharynx clear of any exudate or lesions.Normal dentition.  Neck: normal, supple, no masses, no thyromegaly Respiratory: clear to auscultation bilaterally, no wheezing, no crackles. Normal respiratory effort. No accessory muscle use.  Cardiovascular: Regular rate and rhythm, no murmurs / rubs / gallops. No extremity edema. 2+ pedal pulses. No carotid bruits.  Abdomen: no tenderness, no masses palpated. No hepatosplenomegaly. Bowel sounds positive.  Musculoskeletal: no clubbing / cyanosis. No joint deformity upper and lower extremities. Good ROM, no contractures. Normal muscle tone.  Skin: no rashes, lesions, ulcers. No induration Neurologic: Expressive aphasia noted (patient has word finding difficulty).  Power 5 out of 5 in all 4 extremities, sensation intact.   Psychiatric: Normal judgment and insight. Alert and oriented x 3. Normal mood.    Labs on Admission: I have personally reviewed following labs and imaging  studies  CBC: Recent Labs  Lab 06/19/20 1351 06/19/20 1353  WBC 8.5  --   NEUTROABS 3.8  --   HGB 13.8 14.6  HCT 43.0 43.0  MCV 92.3  --   PLT 211  --    Basic Metabolic Panel: Recent Labs  Lab 06/19/20 1351 06/19/20 1353  NA 139 141  K 3.9 3.8  CL 106 106  CO2 20*  --   GLUCOSE 119* 115*  BUN 11 13  CREATININE 1.45* 1.30*  CALCIUM 9.1  --    GFR: CrCl cannot be calculated (Unknown ideal weight.). Liver Function Tests: Recent Labs  Lab 06/19/20 1351  AST 16  ALT 12  ALKPHOS 95  BILITOT 0.6  PROT 7.1  ALBUMIN 3.7   No results for input(s): LIPASE, AMYLASE in the last 168 hours. No results for input(s): AMMONIA in the last 168 hours. Coagulation Profile: Recent Labs  Lab 06/19/20 1351  INR 1.0   Cardiac Enzymes: No results for input(s): CKTOTAL, CKMB, CKMBINDEX, TROPONINI  in the last 168 hours. BNP (last 3 results) No results for input(s): PROBNP in the last 8760 hours. HbA1C: No results for input(s): HGBA1C in the last 72 hours. CBG: No results for input(s): GLUCAP in the last 168 hours. Lipid Profile: No results for input(s): CHOL, HDL, LDLCALC, TRIG, CHOLHDL, LDLDIRECT in the last 72 hours. Thyroid Function Tests: No results for input(s): TSH, T4TOTAL, FREET4, T3FREE, THYROIDAB in the last 72 hours. Anemia Panel: No results for input(s): VITAMINB12, FOLATE, FERRITIN, TIBC, IRON, RETICCTPCT in the last 72 hours. Urine analysis: No results found for: COLORURINE, APPEARANCEUR, LABSPEC, [place], GLUCOSEU, [place], [place], [place], PROTEINUR, UROBILINOGEN, NITRITE, LEUKOCYTESUR  Radiological Exams on Admission: CT Code Stroke CTA Head W/WO contrast  Result Date: 06/19/2020 CLINICAL DATA:  Neuro deficit, acute, stroke suspected. Additional history provided: Aphasia. EXAM: CT ANGIOGRAPHY HEAD AND NECK CT PERFUSION BRAIN TECHNIQUE: Multidetector CT imaging of the head and neck was performed using the standard protocol during bolus administration of  intravenous contrast. Multiplanar CT image reconstructions and MIPs were obtained to evaluate the vascular anatomy. Carotid stenosis measurements (when applicable) are obtained utilizing NASCET criteria, using the distal internal carotid diameter as the denominator. Multiphase CT imaging of the brain was performed following IV bolus contrast injection. Subsequent parametric perfusion maps were calculated using RAPID software. CONTRAST:  Administered contrast not known at this time. COMPARISON:  Noncontrast head CT performed earlier the same day 06/19/2020. FINDINGS: CTA NECK FINDINGS The examination is mildly motion degraded. Aortic arch: Standard aortic branching. Atherosclerotic plaque within the visualized aortic arch and proximal major branch vessels of the neck. Right carotid system: CCA and ICA patent within the neck without stenosis. Minimal mixed plaque within carotid bifurcation. Left carotid system: See seen ICA patent within the neck without stenosis. Minimal mixed plaque within the carotid bifurcation and proximal ICA. Vertebral arteries: The vertebral arteries are patent within the neck. 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 However, this may be accentuated by vessel tortuosity and motion artifact Skeleton: No acute bony abnormality or aggressive osseous lesion. Cervical spondylosis with multilevel disc space narrowing, posterior disc osteophytes, uncovertebral and facet hypertrophy. Other neck: No neck mass or cervical lymphadenopathy. Multiple thyroid nodules measuring up to 14 mm, not meeting consensus criteria for ultrasound follow-up. Upper chest: No consolidation within the imaged lung apices. No visible pneumothorax. Review of the MIP images confirms the above findings CTA HEAD FINDINGS Anterior circulation: The intracranial internal carotid arteries are patent. A stent is present within the right ICA extending from the pre cavernous to the supraclinoid  segment. 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 The M1 middle cerebral arteries are patent without significant stenosis. 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 The anterior cerebral arteries are patent. Posterior circulation: The intracranial vertebral arteries are patent. The basilar artery is patent. The posterior cerebral arteries are patent proximally without significant stenosis. Posterior communicating arteries are hypoplastic or absent bilaterally. Venous sinuses: Within limitations of contrast timing, no convincing thrombus. Anatomic variants: As described Review of the MIP images confirms the above findings CT Brain Perfusion Findings: There is significant motion degradation, limiting the reliability of the perfusion data. CBF (<30%) Volume: 17mL Perfusion (Tmax>6.0s) volume: 64mL (predominantly within the left frontoparietal lobes) (additional small foci in the region  of the right temporal lobe). Mismatch Volume: 38mL Infarction Location:None identified These results were called by telephone at the time of interpretation on 06/19/2020 at 2:39 pm to provider Dr. Rory Percy, who verbally acknowledged these results. IMPRESSION: CTA neck: 1. The bilateral common and internal carotid arteries are patent within the neck without stenosis 2. The vertebral arteries are patent within the neck bilaterally. 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 However, this apparent stenosis may be accentuated by vessel tortuosity and motion artifact. 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 CT perfusion head: There is significant motion degradation which limits the reliability of the perfusion  data. The perfusion software identifies a 15 mL region of critically hypoperfused parenchyma utilizing the Tmax>6 seconds threshold. This hypoperfusion is predominantly located within the left frontoparietal lobes. Reported mismatch volume 15 mL. Electronically Signed   By: Kellie Simmering DO   On: 06/19/2020 14:39   CT Code Stroke CTA Neck W/WO contrast  Result Date: 06/19/2020 CLINICAL DATA:  Neuro deficit, acute, stroke suspected. Additional history provided: Aphasia. EXAM: CT ANGIOGRAPHY HEAD AND NECK CT PERFUSION BRAIN TECHNIQUE: Multidetector CT imaging of the head and neck was performed using the standard protocol during bolus administration of intravenous contrast. Multiplanar CT image reconstructions and MIPs were obtained to evaluate the vascular anatomy. Carotid stenosis measurements (when applicable) are obtained utilizing NASCET criteria, using the distal internal carotid diameter as the denominator. Multiphase CT imaging of the brain was performed following IV bolus contrast injection. Subsequent parametric perfusion maps were calculated using RAPID software. CONTRAST:  Administered contrast not known at this time. COMPARISON:  Noncontrast head CT performed earlier the same day 06/19/2020. FINDINGS: CTA NECK FINDINGS The examination is mildly motion degraded. Aortic arch: Standard aortic branching. Atherosclerotic plaque within the visualized aortic arch and proximal major branch vessels of the neck. Right carotid system: CCA and ICA patent within the neck without stenosis. Minimal mixed plaque within carotid bifurcation. Left carotid system: See seen ICA patent within the neck without stenosis. Minimal mixed plaque within the carotid bifurcation and proximal ICA. Vertebral arteries: The vertebral arteries are patent within the neck. 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 However, this may be accentuated by vessel tortuosity and motion artifact Skeleton: No  acute bony abnormality or aggressive osseous lesion. Cervical spondylosis with multilevel disc space narrowing, posterior disc osteophytes, uncovertebral and facet hypertrophy. Other neck: No neck mass or cervical lymphadenopathy. Multiple thyroid nodules measuring up to 14 mm, not meeting consensus criteria for ultrasound follow-up. Upper chest: No consolidation within the imaged lung apices. No visible pneumothorax. Review of the MIP images confirms the above findings CTA HEAD FINDINGS Anterior circulation: The intracranial internal carotid arteries are patent. A stent is present within the right ICA extending from the pre cavernous to the supraclinoid segment. 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 The M1 middle cerebral arteries are patent without significant stenosis. 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 The anterior cerebral arteries are patent. Posterior circulation: The intracranial vertebral arteries are patent. The basilar artery is patent. The posterior cerebral arteries are patent proximally without significant stenosis. Posterior communicating arteries are hypoplastic or absent bilaterally. Venous sinuses: Within limitations of contrast timing, no convincing  thrombus. Anatomic variants: As described Review of the MIP images confirms the above findings CT Brain Perfusion Findings: There is significant motion degradation, limiting the reliability of the perfusion data. CBF (<30%) Volume: 19mL Perfusion (Tmax>6.0s) volume: 11mL (predominantly within the left frontoparietal lobes) (additional small foci in the region of the right temporal lobe). Mismatch Volume: 75mL Infarction Location:None identified These results were called by telephone at  the time of interpretation on 06/19/2020 at 2:39 pm to provider Dr. Rory Percy, who verbally acknowledged these results. IMPRESSION: CTA neck: 1. The bilateral common and internal carotid arteries are patent within the neck without stenosis 2. The vertebral arteries are patent within the neck bilaterally. 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 However, this apparent stenosis may be accentuated by vessel tortuosity and motion artifact. 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 CT perfusion head: There is significant motion degradation which limits the reliability of the perfusion data. The perfusion software identifies a 15 mL region of critically hypoperfused parenchyma utilizing the Tmax>6 seconds threshold. This hypoperfusion is predominantly located within the left frontoparietal lobes. Reported mismatch volume 15 mL. Electronically Signed   By: Kellie Simmering DO   On: 06/19/2020 14:39   CT Code Stroke Cerebral Perfusion with contrast  Result Date: 06/19/2020 CLINICAL DATA:  Neuro deficit, acute, stroke suspected. Additional history provided: Aphasia. EXAM: CT ANGIOGRAPHY HEAD AND NECK CT PERFUSION BRAIN TECHNIQUE: Multidetector CT imaging of the head and neck was performed using the standard protocol during bolus administration of intravenous contrast. Multiplanar CT image reconstructions and MIPs were obtained to evaluate the vascular anatomy. Carotid stenosis measurements (when applicable) are obtained utilizing NASCET criteria, using the distal internal carotid diameter as the denominator. Multiphase CT imaging of the brain was performed following IV bolus contrast injection. Subsequent parametric perfusion maps were calculated using RAPID software. CONTRAST:  Administered contrast not known at this time. COMPARISON:  Noncontrast head CT performed earlier the same day 06/19/2020. FINDINGS: CTA NECK FINDINGS The examination is mildly  motion degraded. Aortic arch: Standard aortic branching. Atherosclerotic plaque within the visualized aortic arch and proximal major branch vessels of the neck. Right carotid system: CCA and ICA patent within the neck without stenosis. Minimal mixed plaque within carotid bifurcation. Left carotid system: See seen ICA patent within the neck without stenosis. Minimal mixed plaque within the carotid bifurcation and proximal ICA. Vertebral arteries: The vertebral arteries are patent within the neck. 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 However, this may be accentuated by vessel tortuosity and motion artifact Skeleton: No acute bony abnormality or aggressive osseous lesion. Cervical spondylosis with multilevel disc space narrowing, posterior disc osteophytes, uncovertebral and facet hypertrophy. Other neck: No neck mass or cervical lymphadenopathy. Multiple thyroid nodules measuring up to 14 mm, not meeting consensus criteria for ultrasound follow-up. Upper chest: No consolidation within the imaged lung apices. No visible pneumothorax. Review of the MIP images confirms the above findings CTA HEAD FINDINGS Anterior circulation: The intracranial internal carotid arteries are patent. A stent is present within the right ICA extending from the pre cavernous to the supraclinoid segment. 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 The M1 middle cerebral arteries are patent without significant stenosis. There is abrupt occlusion of an inferior division mid M2 left MCA branch vessel (series 13, image 29) (series  8, images 90 1-89). The anterior cerebral arteries are patent. Posterior circulation: The intracranial vertebral arteries are patent. The basilar artery is patent. The posterior cerebral arteries are patent  proximally without significant stenosis. Posterior communicating arteries are hypoplastic or absent bilaterally. Venous sinuses: Within limitations of contrast timing, no convincing thrombus. Anatomic variants: As described Review of the MIP images confirms the above findings CT Brain Perfusion Findings: There is significant motion degradation, limiting the reliability of the perfusion data. CBF (<30%) Volume: 30mL Perfusion (Tmax>6.0s) volume: 24mL (predominantly within the left frontoparietal lobes) (additional small foci in the region of the right temporal lobe). Mismatch Volume: 9mL Infarction Location:None identified These results were called by telephone at the time of interpretation on 06/19/2020 at 2:39 pm to provider Dr. Rory Percy, who verbally acknowledged these results. IMPRESSION: CTA neck: 1. The bilateral common and internal carotid arteries are patent within the neck without stenosis 2. The vertebral arteries are patent within the neck bilaterally. 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 However, this apparent stenosis may be accentuated by vessel tortuosity and motion artifact. 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 CT perfusion head: There is significant motion degradation which limits the reliability of the perfusion data. The perfusion software identifies a 15 mL region of critically hypoperfused parenchyma utilizing the Tmax>6 seconds threshold. This hypoperfusion is predominantly located within the left frontoparietal lobes. Reported mismatch volume 15 mL. Electronically Signed   By: Kellie Simmering DO   On: 06/19/2020 14:39   CT HEAD CODE STROKE WO CONTRAST  Result Date: 06/19/2020 CLINICAL DATA:  Code stroke. Neuro deficit, acute, stroke suspected. EXAM: CT HEAD WITHOUT CONTRAST TECHNIQUE: Contiguous axial images were obtained from the base of the skull through the vertex without intravenous contrast. COMPARISON:   Report from brain MRI and MRA head 09/21/2002 (images unavailable). Head CT 09/21/2002. FINDINGS: Brain: Moderate generalized parenchymal atrophy. There are chronic cortically based infarcts within the right parietal lobe and left temporal occipital lobe. Chronic infarcts within the bilateral basal ganglia. Background moderate ill-defined hypoattenuation within the cerebral white matter which is nonspecific, but consistent with chronic small vessel ischemic disease. Small chronic appearing infarcts within the left cerebellar hemisphere. There is no acute intracranial hemorrhage. No acute demarcated cortical infarct. No extra-axial fluid collection. No evidence of intracranial mass. No midline shift. Vascular: No hyperdense vessel is identified. Right ICA stent. 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 Skull: Normal. Negative for fracture or focal lesion. Sinuses/Orbits: Visualized orbits show no acute finding. No significant paranasal sinus disease or mastoid effusion at the imaged levels ASPECTS [facility] Stroke Program Early CT Score) - Ganglionic level infarction (caudate, lentiform nuclei, internal capsule, insula, M1-M3 cortex): 7 - Supraganglionic infarction (M4-M6 cortex): 3 Total score (0-10 with 10 being normal): 10 (when discounting chronic infarcts) These results were communicated to Dr. Rory Percy At 2:09 pmon 9/12/2021by text page via the [facility] messaging system. IMPRESSION: No CT evidence of acute intracranial abnormality. ASPECTS is 10 (when discounting chronic infarcts). 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 Chronic lacunar infarcts within the bilateral basal ganglia. Small chronic appearing infarcts within the left cerebellar hemisphere. Background moderate generalized parenchymal atrophy and chronic small vessel ischemic disease. 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 Electronically Signed   By: Kellie Simmering DO   On: 06/19/2020 14:09    EKG: Independently reviewed.  Sinus rhythm.  No ST  elevation or depression noted.  Assessment/Plan Principal Problem:   Ischemic stroke [facility]) Active Problems:   Hypertension   Breast cancer of upper-outer quadrant of right female breast ([place])   Hyperlipidemia   Acute on chronic kidney failure (HCC)   Aphasia   Tobacco abuse    Ischemic stroke: -Patient presented with word finding difficulty.  Reviewed CT head, CTA head and neck and perfusion head study.  Vital signs stable.  COVID-19 pending.  She is outside of window for IV TPA and endovascular thrombectomy due to distal location of the clot. -Risk factors for stroke: Hypertension, hyperlipidemia, tobacco abuse -admit forTelemetry monitoring -Stroke protocol -Allow for permissive hypertension for the first 24-48h - only treat PRN if SBP >242 mmHg or diastolic blood pressure >683. Blood pressures can be gradually normalized to SBP<140 upon discharge. -Ordered MRI brain and transthoracic echo -Ordered lipid Panel and A1C -Start on aspirin, continue atorvastatin -Frequent neuro checks -Atorvastatin PO within 24 hrs. -Consult Neurology -PT/OT eval, Speech consult -We will keep her n.p.o. until she passes bedside swallow evaluation.  AKI on CKD stage IIIa: -Creatinine: 1.45, GFR: 41 (baseline creatinine: 1.23, GFR: 52) -Continue gentle hydration.  Avoid nephrotoxic medication.  Repeat BMP tomorrow a.m.  Hypertension: -Hold amlodipine and enalapril to allow permissive hypertension.  Monitor blood pressure closely.  Hyperlipidemia: Check lipid panel -Continue statin  Type 2 diabetes mellitus: Check A1c -Hold Amaryl.  Started patient on sliding scale insulin and monitor blood sugar closely  Diabetic neuropathy: Continue gabapentin  Mild intermittent asthma: Continue proair as needed  Tobacco abuse: Counseled about cessation  History of right upper quadrant breast cancer: Status post  mastectomy.  DVT prophylaxis: Lovenox/SCD Code Status: Full code  family Communication: None present at bedside.  Plan of care discussed with patient in length and she verbalized understanding and agreed with it.  I discussed plan of care with patient's family (daughter Kenney Houseman and patient's niece Leda Gauze worthy) on the phone-both verbalized understanding.  Please reach out patient's niece Ms Emeline Darling for updates on this number-[phone] if unable to reach patient's daughter.   Disposition Plan: Home/rehab in 1 to 2 days Consults called: Neurology by EDP Admission status: Inpatient   Mckinley Jewel MD [facility]  If 7PM-7AM, please contact night-coverage [email or web address] Password [facility]  06/19/2020, 3:29 PM

## 2020-06-20 ENCOUNTER — Inpatient Hospital Stay (HOSPITAL_COMMUNITY): Payer: Medicare Other

## 2020-06-20 DIAGNOSIS — E785 Hyperlipidemia, unspecified: Secondary | ICD-10-CM

## 2020-06-20 DIAGNOSIS — R569 Unspecified convulsions: Principal | ICD-10-CM

## 2020-06-20 DIAGNOSIS — Z720 Tobacco use: Secondary | ICD-10-CM

## 2020-06-20 LAB — GLUCOSE, CAPILLARY
Glucose-Capillary: 111 mg/dL — ABNORMAL HIGH (ref 70–99)
Glucose-Capillary: 95 mg/dL (ref 70–99)
Glucose-Capillary: 109 mg/dL — ABNORMAL HIGH (ref 70–99)

## 2020-06-20 LAB — CBC WITH DIFFERENTIAL/PLATELET
Abs Immature Granulocytes: 0.03 10*3/uL (ref 0.00–0.07)
Basophils Absolute: 0.1 10*3/uL (ref 0.0–0.1)
Basophils Relative: 1 %
Eosinophils Absolute: 0.2 10*3/uL (ref 0.0–0.5)
Eosinophils Relative: 2 %
HCT: 39 % (ref 36.0–46.0)
Hemoglobin: 12.5 g/dL (ref 12.0–15.0)
Immature Granulocytes: 0 %
Lymphocytes Relative: 43 %
Lymphs Abs: 3.6 10*3/uL (ref 0.7–4.0)
MCH: 29.8 pg (ref 26.0–34.0)
MCHC: 32.1 g/dL (ref 30.0–36.0)
MCV: 93.1 fL (ref 80.0–100.0)
Monocytes Absolute: 0.5 10*3/uL (ref 0.1–1.0)
Monocytes Relative: 6 %
Neutro Abs: 4.1 10*3/uL (ref 1.7–7.7)
Neutrophils Relative %: 48 %
Platelets: 203 10*3/uL (ref 150–400)
RBC: 4.19 MIL/uL (ref 3.87–5.11)
RDW: 14 % (ref 11.5–15.5)
WBC: 8.6 10*3/uL (ref 4.0–10.5)
nRBC: 0 % (ref 0.0–0.2)

## 2020-06-20 LAB — BASIC METABOLIC PANEL
Anion gap: 8 (ref 5–15)
BUN: 9 mg/dL (ref 8–23)
CO2: 26 mmol/L (ref 22–32)
Calcium: 8.7 mg/dL — ABNORMAL LOW (ref 8.9–10.3)
Chloride: 107 mmol/L (ref 98–111)
Creatinine, Ser: 1.37 mg/dL — ABNORMAL HIGH (ref 0.44–1.00)
GFR calc Af Amer: 44 mL/min — ABNORMAL LOW (ref >60)
GFR calc non Af Amer: 38 mL/min — ABNORMAL LOW (ref >60)
Glucose, Bld: 103 mg/dL — ABNORMAL HIGH (ref 70–99)
Potassium: 3.6 mmol/L (ref 3.5–5.1)
Sodium: 141 mmol/L (ref 135–145)

## 2020-06-20 LAB — LIPID PANEL
Cholesterol: 112 mg/dL (ref 0–200)
HDL: 33 mg/dL — ABNORMAL LOW (ref >40)
LDL Cholesterol: 53 mg/dL (ref 0–99)
Total CHOL/HDL Ratio: 3.4 RATIO
Triglycerides: 129 mg/dL (ref <150)
VLDL: 26 mg/dL (ref 0–40)

## 2020-06-20 LAB — HEMOGLOBIN A1C
Hgb A1c MFr Bld: 6.2 % — ABNORMAL HIGH (ref 4.8–5.6)
Mean Plasma Glucose: 131.24 mg/dL

## 2020-06-20 MED ORDER — MOMETASONE FURO-FORMOTEROL FUM 200-5 MCG/ACT IN AERO
2.0000 | INHALATION_SPRAY | Freq: Two times a day (BID) | RESPIRATORY_TRACT | Status: DC
  Administered 2020-06-20 – 2020-06-21 (×2): 2 via RESPIRATORY_TRACT
  Filled 2020-06-20: qty 8.8

## 2020-06-20 MED ORDER — INSULIN ASPART 100 UNIT/ML ~~LOC~~ SOLN: 0.0000 [IU] | Freq: Every day | SUBCUTANEOUS | Status: DC

## 2020-06-20 MED ORDER — GABAPENTIN 300 MG PO CAPS
300.0000 mg | ORAL_CAPSULE | Freq: Three times a day (TID) | ORAL | Status: DC
  Administered 2020-06-20 – 2020-06-21 (×4): 300 mg via ORAL
  Filled 2020-06-20 (×4): qty 1

## 2020-06-20 MED ORDER — ATORVASTATIN CALCIUM 80 MG PO TABS: 80.0000 mg | ORAL_TABLET | Freq: Every day | ORAL | Status: DC

## 2020-06-20 MED ORDER — LEVETIRACETAM IN NACL 1000 MG/100ML IV SOLN
1000.0000 mg | Freq: Once | INTRAVENOUS | Status: AC
  Administered 2020-06-20: 1000 mg via INTRAVENOUS
  Filled 2020-06-20: qty 100

## 2020-06-20 MED ORDER — FOLIC ACID 1 MG PO TABS
1.0000 mg | ORAL_TABLET | Freq: Every day | ORAL | Status: DC
  Administered 2020-06-20 – 2020-06-21 (×2): 1 mg via ORAL
  Filled 2020-06-20 (×2): qty 1

## 2020-06-20 MED ORDER — ASPIRIN EC 81 MG PO TBEC
81.0000 mg | DELAYED_RELEASE_TABLET | Freq: Every day | ORAL | Status: DC
  Administered 2020-06-21: 81 mg via ORAL
  Filled 2020-06-20: qty 1

## 2020-06-20 MED ORDER — ATORVASTATIN CALCIUM 10 MG PO TABS
20.0000 mg | ORAL_TABLET | Freq: Every day | ORAL | Status: DC
  Administered 2020-06-20: 20 mg via ORAL
  Filled 2020-06-20: qty 2

## 2020-06-20 MED ORDER — NORTRIPTYLINE HCL 25 MG PO CAPS
25.0000 mg | ORAL_CAPSULE | Freq: Every day | ORAL | Status: DC
  Administered 2020-06-21: 25 mg via ORAL
  Filled 2020-06-20 (×3): qty 1

## 2020-06-20 MED ORDER — ALBUTEROL SULFATE (2.5 MG/3ML) 0.083% IN NEBU: 2.5000 mg | INHALATION_SOLUTION | Freq: Four times a day (QID) | RESPIRATORY_TRACT | Status: DC | PRN

## 2020-06-20 MED ORDER — VITAMIN D (ERGOCALCIFEROL) 1.25 MG (50000 UNIT) PO CAPS: 50000.0000 [IU] | ORAL_CAPSULE | ORAL | Status: DC

## 2020-06-20 MED ORDER — LEVETIRACETAM 500 MG PO TABS
500.0000 mg | ORAL_TABLET | Freq: Two times a day (BID) | ORAL | Status: DC
  Administered 2020-06-21 (×2): 500 mg via ORAL
  Filled 2020-06-20 (×2): qty 1

## 2020-06-20 MED ORDER — CLOPIDOGREL BISULFATE 75 MG PO TABS
75.0000 mg | ORAL_TABLET | Freq: Every day | ORAL | Status: DC
  Administered 2020-06-20 – 2020-06-21 (×2): 75 mg via ORAL
  Filled 2020-06-20 (×2): qty 1

## 2020-06-20 MED ORDER — LEVETIRACETAM ER 500 MG PO TB24: 500.0000 mg | ORAL_TABLET | Freq: Every day | ORAL | Status: DC

## 2020-06-20 MED ORDER — SODIUM CHLORIDE 0.9 % IV SOLN: INTRAVENOUS | Status: DC

## 2020-06-20 MED ORDER — INSULIN ASPART 100 UNIT/ML ~~LOC~~ SOLN
0.0000 [IU] | Freq: Three times a day (TID) | SUBCUTANEOUS | Status: DC
  Administered 2020-06-21: 1 [IU] via SUBCUTANEOUS

## 2020-06-20 MED ORDER — ALBUTEROL SULFATE HFA 108 (90 BASE) MCG/ACT IN AERS: 1.0000 | INHALATION_SPRAY | Freq: Four times a day (QID) | RESPIRATORY_TRACT | Status: DC | PRN

## 2020-06-20 NOTE — Progress Notes (Signed)
EEG Completed; Results Pending  

## 2020-06-20 NOTE — Evaluation (Signed)
Speech Language Pathology Evaluation Patient Details Name: Pamela Hess MRN: [id number] DOB: 1946/06/27 Today's Date: 06/20/2020 Time: 5956-3875 SLP Time Calculation (min) (ACUTE ONLY): 42 min  Problem List:  Patient Active Problem List   Diagnosis Date Noted  . Hyperlipidemia   . Acute on chronic kidney failure ([place])   . Ischemic stroke ([place])   . Aphasia   . Tobacco abuse   . Breast cancer of upper-outer quadrant of right female breast ([place]) 08/31/2013  . 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 GOUT 03/24/2007  . ALCOHOL ABUSE 03/24/2007  . PERIPHERAL NEUROPATHY 03/24/2007  . Hypertension 03/24/2007  . CEREBROVASCULAR DISEASE LATE EFFECTS, NOS 03/24/2007  . COPD 03/24/2007  . Ganglion of joint 03/24/2007  . TB SKIN TEST, POSITIVE, HX OF 03/24/2007  . HYSTERECTOMY, PARTIAL, HX OF 03/24/2007  . CEREBRAL ANEURYSM 01/05/2003   Past Medical History:  Past Medical History:  Diagnosis Date  . Asthma   . Cancer [facility]) 2014   BREAST CANCER  . Chronic kidney disease   . Diabetes mellitus without complication ([place])   . Full dentures   . History of cerebral aneurysm repair   . Hyperlipidemia   . Hypertension   . Peripheral neuropathy   . Stroke ([place])   . Wears glasses    readers   Past Surgical History:  Past Surgical History:  Procedure Laterality Date  . BREAST BIOPSY Right 07/28/2013   Procedure: BREAST BIOPSY WITH NEEDLE LOCALIZATION;  Surgeon: Rolm Bookbinder, MD;  Location: [place];  Service: General;  Laterality: Right;  needle loc BGC   . CEREBRAL ANEURYSM REPAIR  1999  . GANGLION CYST EXCISION  1968   rt wrist  . MASTECTOMY    . MASTECTOMY W/ SENTINEL NODE BIOPSY Right 08/21/2013   Dr Donne Hazel   . MASTECTOMY W/ SENTINEL NODE BIOPSY Right 08/21/2013   Procedure: MASTECTOMY WITH SENTINEL LYMPH NODE BIOPSY;  Surgeon: Rolm Bookbinder, MD;  Location: [place];  Service: General;  Laterality: Right;   HPI:  Pt is a 74 y.o.  female with medical history significant of hypertension, hyperlipidemia, CKD stage III, right breast cancer status post mastectomy, tobacco abuse, and mild intermittent asthma. She presented to the ED for evaluation of aphasia. Pt's daughter reports that she found the patient was not behaving like herself, she had confusion and word-finding difficulty.   Assessment / Plan / Recommendation Clinical Impression  Pt was alert and cooperative through the SLE. She acheived a bedside aphasia score of 80/100 on the Bedside WAB. She demonstrated circumlocutory, fluent speech with paraphasias and word-finding difficulty. Auditory verbal comprehension was mildly impaired as characterized by difficulty following complex, multi-step directions. During a naming task, the pt was provided max verbal cues in order to find a word, but was still unsuccessful. Recommend SLP f/u to address word-finding difficulties.       SLP Assessment  SLP Recommendation/Assessment: Patient needs continued Speech Lanaguage Pathology Services SLP Visit Diagnosis: Cognitive communication deficit (R41.841)    Follow Up Recommendations  Outpatient SLP    Frequency and Duration min 2x/week  2 weeks      SLP Evaluation Cognition  Overall Cognitive Status: Within Functional Limits for tasks assessed Arousal/Alertness: Awake/alert Orientation Level: Oriented X4 Attention: Focused;Sustained Focused Attention: Appears intact Sustained Attention: Appears intact Memory: Appears intact Awareness: Appears intact Problem Solving: Appears intact Executive Function: Sequencing Sequencing: Impaired Sequencing Impairment: Verbal complex Safety/Judgment: Appears intact       Comprehension  Auditory Comprehension Overall  Auditory Comprehension: Impaired Yes/No Questions: Within Functional Limits Commands: Impaired Complex Commands: 25-49% accurate Conversation: Simple Visual Recognition/Discrimination Discrimination: Within  Function Limits Reading Comprehension Reading Status: Within funtional limits    Expression Expression Primary Mode of Expression: Verbal Verbal Expression Overall Verbal Expression: Impaired Initiation: No impairment Automatic Speech: Name;Social Response Level of Generative/Spontaneous Verbalization: Word;Phrase;Sentence Repetition: No impairment Naming: Impairment Responsive: 76-100% accurate Confrontation: Impaired Convergent: 75-100% accurate Verbal Errors: Phonemic paraphasias;Semantic paraphasias;Language of confusion Pragmatics: No impairment Written Expression Dominant Hand: Right Written Expression: Within Functional Limits   Oral / Motor  Oral Motor/Sensory Function Overall Oral Motor/Sensory Function: Within functional limits Motor Speech Overall Motor Speech: Appears within functional limits for tasks assessed Respiration: Within functional limits Phonation: Normal Resonance: Within functional limits Articulation: Within functional limitis Intelligibility: Intelligible Motor Planning: Witnin functional limits Motor Speech Errors: Not applicable   GO                    Greggory Keen 06/20/2020, 11:09 AM

## 2020-06-20 NOTE — Evaluation (Signed)
Physical Therapy Evaluation Patient Details Name: Pamela Hess MRN: [id number] DOB: 1946/08/10 Today's Date: 06/20/2020   History of Present Illness  The pt is a 74 yo female presenting from home with expressive aphasisa and concern for a stroke. CTA revealed abrupt occlusion of an inferior division mid M2 left MCA branch. PMH includes: HTN, HLD, DM, CKD, breast cancer, and cerebral aneurysm s/p coiling in early 2000.    Clinical Impression  Pt in bed upon arrival of PT, agreeable to evaluation at this time. Prior to admission the pt was ambulating with use of a cane, living alone, and independent with all ADLs and IADLs such as gardening, home care, and walking 20 min to the grocery store or bank. The pt now presents with minor limitations in functional mobility, strength, and dynamic stability due to above dx, and will continue to benefit from skilled PT while in the hospital to address these deficits. The pt was able to demo good independence with bed mobility and initial transfers with use of cane. She was able to complete navigation of multiple stairs and 250 ft hallway ambulation with no LOB, but walks with a slowed gait and minor evidence of instability (lateral sway, short strides). She will continue to benefit from skilled PT acutely to progress dynamic stability to reduce risk of falls following d/c home, but will be safe to return home with assist from her daughter and grandson.      Follow Up Recommendations No PT follow up;Supervision - Intermittent    Equipment Recommendations  None recommended by PT (pt has all needed equipment)    Recommendations for Other Services       Precautions / Restrictions Precautions Precautions: Fall Precaution Comments: low fall Restrictions Weight Bearing Restrictions: No      Mobility  Bed Mobility Overal bed mobility: Modified Independent                Transfers Overall transfer level: Needs assistance Equipment used:  Straight cane Transfers: Sit to/from Stand Sit to Stand: Supervision         General transfer comment: supervision for safety, pt slow to rise  Ambulation/Gait Ambulation/Gait assistance: Supervision Gait Distance (Feet): 250 Feet Assistive device: Straight cane Gait Pattern/deviations: Step-through pattern;Trunk flexed;Decreased stride length;Antalgic Gait velocity: 0.4 m/s Gait velocity interpretation: <1.31 ft/sec, indicative of household ambulator General Gait Details: pt with slowed but generally steady gait with cane. reports this is how she normally walks at baseline. able to change directions and find her way back to the room with minimal cues.  Stairs Stairs: Yes Stairs assistance: Min guard Stair Management: One rail Right;With cane;Forwards Number of Stairs: 6 General stair comments: pt uses alternating pattern and step-to interchangably. prefers stepping up with RLE  Wheelchair Mobility    Modified Rankin (Stroke Patients Only) Modified Rankin (Stroke Patients Only) Pre-Morbid Rankin Score: No symptoms Modified Rankin: Moderate disability     Balance Overall balance assessment: Mild deficits observed, not formally tested                                           Pertinent Vitals/Pain Pain Assessment: Faces Faces Pain Scale: Hurts a little bit Pain Location: gout in L knee Pain Descriptors / Indicators: Sore Pain Intervention(s): Limited activity within patient's tolerance;Monitored during session    Home Living Family/patient expects to be discharged to:: Private residence Living Arrangements: Alone Available  Help at Discharge: Family;Available PRN/intermittently (daughter and grandson nearby but work) Type of Home: House Home Access: Level entry (could be 2 steps at back)     [place]: One level [place]: Grab bars - tub/shower;Hand held shower head;Shower seat;Grab bars - toilet;Cane - single point;Walker - 2  wheels;Walker - 4 wheels;Bedside commode      Prior Function           Comments: Pt reports independent living alone, can walk to grocery store (20 min) or bank as needed with cane. does not drive     Hand Dominance   Dominant Hand: Right    Extremity/Trunk Assessment   Upper Extremity Assessment Upper Extremity Assessment: Generalized weakness    Lower Extremity Assessment Lower Extremity Assessment: Generalized weakness    Cervical / Trunk Assessment Cervical / Trunk Assessment: Kyphotic  Communication   Communication: Expressive difficulties (word-finding difficulties)  Cognition Arousal/Alertness: Awake/alert Behavior During Therapy: WFL for tasks assessed/performed Overall Cognitive Status: Within Functional Limits for tasks assessed                                               Assessment/Plan    PT Assessment Patient needs continued PT services  PT Problem List Decreased strength;Decreased mobility;Decreased balance       PT Treatment Interventions DME instruction;Therapeutic exercise;Balance training;Gait training;Stair training;Functional mobility training;Therapeutic activities;Patient/family education    PT Goals (Current goals can be found in the Care Plan section)  Acute Rehab PT Goals Patient Stated Goal: return home so she can work in her garden PT Goal Formulation: With patient Time For Goal Achievement: 07/04/20 Potential to Achieve Goals: Good    Frequency Min 3X/week    AM-PAC PT "6 Clicks" Mobility  Outcome Measure Help needed turning from your back to your side while in a flat bed without using bedrails?: None Help needed moving from lying on your back to sitting on the side of a flat bed without using bedrails?: None Help needed moving to and from a bed to a chair (including a wheelchair)?: A Little Help needed standing up from a chair using your arms (e.g., wheelchair or bedside chair)?: None Help needed to walk  in hospital room?: A Little Help needed climbing 3-5 steps with a railing? : A Little 6 Click Score: 21    End of Session Equipment Utilized During Treatment: Gait belt Activity Tolerance: Patient tolerated treatment well Patient left: in bed;with call bell/phone within reach (sitting EOB) Nurse Communication: Mobility status PT Visit Diagnosis: Difficulty in walking, not elsewhere classified (R26.2);Muscle weakness (generalized) (M62.81)    Time: 5277-8242 PT Time Calculation (min) (ACUTE ONLY): 28 min   Charges:   PT Evaluation $PT Eval Low Complexity: 1 Low PT Treatments $Gait Training: 8-22 mins        Karma Ganja, PT, DPT   Acute Rehabilitation Department Pager #: [phone]  Otho Bellows 06/20/2020, 11:49 AM

## 2020-06-20 NOTE — Progress Notes (Signed)
STROKE TEAM PROGRESS NOTE   INTERVAL HISTORY Patient reports confusion yesterday. She doesn't remember much. She doesn't remember having difficulty speaking.  I have personally reviewed history of presenting illness, electronic medical records and imaging films in PACS.  CT head is unremarkable.  MRI has not been done since patient has remote history of intracranial and rhythm coiling but MRI come back ability of the coil is not been determined  Vitals:   06/19/20 1500 06/19/20 1645 06/19/20 1900 06/20/20 0546  BP: (!) 143/70  (!) 171/88 (!) 145/83  Pulse: (!) 50 (!) 56 (!) 55 (!) 59  Resp: 18 14 16 16   Temp:   97.7 F (36.5 C) 98 F (36.7 C)  TempSrc:   Oral Oral  SpO2: 98% 99% 100% 98%  Weight:       CBC:  Recent Labs  Lab 06/19/20 1351 06/19/20 1351 06/19/20 1353 06/20/20 0318  WBC 8.5  --   --  8.6  NEUTROABS 3.8  --   --  4.1  HGB 13.8   < > 14.6 12.5  HCT 43.0   < > 43.0 39.0  MCV 92.3  --   --  93.1  PLT 211  --   --  203   < > = values in this interval not displayed.   Basic Metabolic Panel:  Recent Labs  Lab 06/19/20 1351 06/19/20 1351 06/19/20 1353 06/20/20 0318  NA 139   < > 141 141  K 3.9   < > 3.8 3.6  CL 106   < > 106 107  CO2 20*  --   --  26  GLUCOSE 119*   < > 115* 103*  BUN 11   < > 13 9  CREATININE 1.45*   < > 1.30* 1.37*  CALCIUM 9.1  --   --  8.7*   < > = values in this interval not displayed.   Lipid Panel:  Recent Labs  Lab 06/20/20 0318  CHOL 112  TRIG 129  HDL 33*  CHOLHDL 3.4  VLDL 26  LDLCALC 53   HgbA1c:  Recent Labs  Lab 06/20/20 0318  HGBA1C 6.2*   Urine Drug Screen: No results for input(s): LABOPIA, COCAINSCRNUR, LABBENZ, AMPHETMU, THCU, LABBARB in the last 168 hours.  Alcohol Level No results for input(s): ETH in the last 168 hours.  IMAGING past 24 hours CT Code Stroke CTA Head W/WO contrast  Result Date: 06/19/2020 CLINICAL DATA:  Neuro deficit, acute, stroke suspected. Additional history provided: Aphasia.  EXAM: CT ANGIOGRAPHY HEAD AND NECK CT PERFUSION BRAIN TECHNIQUE: Multidetector CT imaging of the head and neck was performed using the standard protocol during bolus administration of intravenous contrast. Multiplanar CT image reconstructions and MIPs were obtained to evaluate the vascular anatomy. Carotid stenosis measurements (when applicable) are obtained utilizing NASCET criteria, using the distal internal carotid diameter as the denominator. Multiphase CT imaging of the brain was performed following IV bolus contrast injection. Subsequent parametric perfusion maps were calculated using RAPID software. CONTRAST:  Administered contrast not known at this time. COMPARISON:  Noncontrast head CT performed earlier the same day 06/19/2020. FINDINGS: CTA NECK FINDINGS The examination is mildly motion degraded. Aortic arch: Standard aortic branching. Atherosclerotic plaque within the visualized aortic arch and proximal major branch vessels of the neck. Right carotid system: CCA and ICA patent within the neck without stenosis. Minimal mixed plaque within carotid bifurcation. Left carotid system: See seen ICA patent within the neck without stenosis. Minimal mixed plaque within the  carotid bifurcation and proximal ICA. Vertebral arteries: The vertebral arteries are patent within the neck. 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 However, this may be accentuated by vessel tortuosity and motion artifact Skeleton: No acute bony abnormality or aggressive osseous lesion. Cervical spondylosis with multilevel disc space narrowing, posterior disc osteophytes, uncovertebral and facet hypertrophy. Other neck: No neck mass or cervical lymphadenopathy. Multiple thyroid nodules measuring up to 14 mm, not meeting consensus criteria for ultrasound follow-up. Upper chest: No consolidation within the imaged lung apices. No visible pneumothorax. Review of the MIP images confirms the above findings CTA  HEAD FINDINGS Anterior circulation: The intracranial internal carotid arteries are patent. A stent is present within the right ICA extending from the pre cavernous to the supraclinoid segment. 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 The M1 middle cerebral arteries are patent without significant stenosis. 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 The anterior cerebral arteries are patent. Posterior circulation: The intracranial vertebral arteries are patent. The basilar artery is patent. The posterior cerebral arteries are patent proximally without significant stenosis. Posterior communicating arteries are hypoplastic or absent bilaterally. Venous sinuses: Within limitations of contrast timing, no convincing thrombus. Anatomic variants: As described Review of the MIP images confirms the above findings CT Brain Perfusion Findings: There is significant motion degradation, limiting the reliability of the perfusion data. CBF (<30%) Volume: 40mL Perfusion (Tmax>6.0s) volume: 35mL (predominantly within the left frontoparietal lobes) (additional small foci in the region of the right temporal lobe). Mismatch Volume: 60mL Infarction Location:None identified These results were called by telephone at the time of interpretation on 06/19/2020 at 2:39 pm to provider Dr. Rory Percy, who verbally acknowledged these results. IMPRESSION: CTA neck: 1. The bilateral common and internal carotid arteries are patent within the neck without stenosis 2. The vertebral arteries are patent within the neck bilaterally. 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 However, this apparent stenosis may be accentuated by vessel tortuosity and motion artifact. CTA  head: Abrupt occlusion of an inferior division mid M2 left MCA branch vessel. CT perfusion head: There is significant motion degradation which limits the reliability of the perfusion data. The perfusion software identifies a 15 mL region of critically hypoperfused parenchyma utilizing the Tmax>6 seconds threshold. This hypoperfusion is predominantly located within the left frontoparietal lobes. Reported mismatch volume 15 mL. Electronically Signed   By: Kellie Simmering DO   On: 06/19/2020 14:39   CT Code Stroke CTA Neck W/WO contrast  Result Date: 06/19/2020 CLINICAL DATA:  Neuro deficit, acute, stroke suspected. Additional history provided: Aphasia. EXAM: CT ANGIOGRAPHY HEAD AND NECK CT PERFUSION BRAIN TECHNIQUE: Multidetector CT imaging of the head and neck was performed using the standard protocol during bolus administration of intravenous contrast. Multiplanar CT image reconstructions and MIPs were obtained to evaluate the vascular anatomy. Carotid stenosis measurements (when applicable) are obtained utilizing NASCET criteria, using the distal internal carotid diameter as the denominator. Multiphase CT imaging of the brain was performed following IV bolus contrast injection. Subsequent parametric perfusion maps were calculated using RAPID software. CONTRAST:  Administered contrast not known at this time. COMPARISON:  Noncontrast head CT performed earlier the same day 06/19/2020. FINDINGS: CTA NECK FINDINGS The examination is mildly motion degraded. Aortic arch: Standard aortic branching.  Atherosclerotic plaque within the visualized aortic arch and proximal major branch vessels of the neck. Right carotid system: CCA and ICA patent within the neck without stenosis. Minimal mixed plaque within carotid bifurcation. Left carotid system: See seen ICA patent within the neck without stenosis. Minimal mixed plaque within the carotid bifurcation and proximal ICA. Vertebral arteries: The vertebral arteries are patent  within the neck. 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 However, this may be accentuated by vessel tortuosity and motion artifact Skeleton: No acute bony abnormality or aggressive osseous lesion. Cervical spondylosis with multilevel disc space narrowing, posterior disc osteophytes, uncovertebral and facet hypertrophy. Other neck: No neck mass or cervical lymphadenopathy. Multiple thyroid nodules measuring up to 14 mm, not meeting consensus criteria for ultrasound follow-up. Upper chest: No consolidation within the imaged lung apices. No visible pneumothorax. Review of the MIP images confirms the above findings CTA HEAD FINDINGS Anterior circulation: The intracranial internal carotid arteries are patent. A stent is present within the right ICA extending from the pre cavernous to the supraclinoid segment. 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 The M1 middle cerebral arteries are patent without significant stenosis. 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 The anterior cerebral arteries are patent. Posterior circulation: The intracranial vertebral arteries are patent. The basilar artery is patent. The posterior cerebral arteries are patent proximally without significant stenosis. Posterior communicating arteries are hypoplastic or absent bilaterally. Venous sinuses: Within limitations of contrast timing, no convincing thrombus. Anatomic variants: As described Review of the MIP images confirms the above findings CT Brain Perfusion Findings: There is significant motion degradation, limiting the reliability of the perfusion data. CBF (<30%) Volume: 39mL Perfusion (Tmax>6.0s) volume: 70mL (predominantly within the left  frontoparietal lobes) (additional small foci in the region of the right temporal lobe). Mismatch Volume: 99mL Infarction Location:None identified These results were called by telephone at the time of interpretation on 06/19/2020 at 2:39 pm to provider Dr. Rory Percy, who verbally acknowledged these results. IMPRESSION: CTA neck: 1. The bilateral common and internal carotid arteries are patent within the neck without stenosis 2. The vertebral arteries are patent within the neck bilaterally. 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 However, this apparent stenosis may be accentuated by vessel tortuosity and motion artifact. 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 CT perfusion head: There is significant motion degradation which limits the reliability of the perfusion data. The perfusion software identifies a 15 mL region of critically hypoperfused parenchyma utilizing the Tmax>6 seconds threshold. This hypoperfusion is predominantly located within the left frontoparietal lobes. Reported mismatch volume 15 mL. Electronically Signed   By: Kellie Simmering DO   On: 06/19/2020 14:39   CT Code Stroke Cerebral Perfusion with contrast  Result Date: 06/19/2020 CLINICAL DATA:  Neuro deficit, acute, stroke suspected. Additional history provided: Aphasia. EXAM: CT ANGIOGRAPHY HEAD AND NECK CT PERFUSION BRAIN TECHNIQUE: Multidetector CT imaging of the head and neck was performed using the standard protocol during bolus administration of intravenous contrast. Multiplanar CT image reconstructions and MIPs were obtained to evaluate the vascular anatomy. Carotid stenosis measurements (when applicable) are obtained utilizing NASCET criteria, using the distal internal carotid diameter as the denominator. Multiphase CT imaging of the brain was  performed following IV bolus contrast injection. Subsequent parametric perfusion maps were calculated using RAPID  software. CONTRAST:  Administered contrast not known at this time. COMPARISON:  Noncontrast head CT performed earlier the same day 06/19/2020. FINDINGS: CTA NECK FINDINGS The examination is mildly motion degraded. Aortic arch: Standard aortic branching. Atherosclerotic plaque within the visualized aortic arch and proximal major branch vessels of the neck. Right carotid system: CCA and ICA patent within the neck without stenosis. Minimal mixed plaque within carotid bifurcation. Left carotid system: See seen ICA patent within the neck without stenosis. Minimal mixed plaque within the carotid bifurcation and proximal ICA. Vertebral arteries: The vertebral arteries are patent within the neck. 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 However, this may be accentuated by vessel tortuosity and motion artifact Skeleton: No acute bony abnormality or aggressive osseous lesion. Cervical spondylosis with multilevel disc space narrowing, posterior disc osteophytes, uncovertebral and facet hypertrophy. Other neck: No neck mass or cervical lymphadenopathy. Multiple thyroid nodules measuring up to 14 mm, not meeting consensus criteria for ultrasound follow-up. Upper chest: No consolidation within the imaged lung apices. No visible pneumothorax. Review of the MIP images confirms the above findings CTA HEAD FINDINGS Anterior circulation: The intracranial internal carotid arteries are patent. A stent is present within the right ICA extending from the pre cavernous to the supraclinoid segment. 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 The M1 middle cerebral arteries are patent without significant stenosis. There is abrupt occlusion of an inferior division mid M2 left MCA branch vessel (series 13, image 29) (series 8, images 90  1-89). The anterior cerebral arteries are patent. Posterior circulation: The intracranial vertebral arteries are patent. The basilar artery is patent. The posterior cerebral arteries are patent proximally without significant stenosis. Posterior communicating arteries are hypoplastic or absent bilaterally. Venous sinuses: Within limitations of contrast timing, no convincing thrombus. Anatomic variants: As described Review of the MIP images confirms the above findings CT Brain Perfusion Findings: There is significant motion degradation, limiting the reliability of the perfusion data. CBF (<30%) Volume: 71mL Perfusion (Tmax>6.0s) volume: 11mL (predominantly within the left frontoparietal lobes) (additional small foci in the region of the right temporal lobe). Mismatch Volume: 67mL Infarction Location:None identified These results were called by telephone at the time of interpretation on 06/19/2020 at 2:39 pm to provider Dr. Rory Percy, who verbally acknowledged these results. IMPRESSION: CTA neck: 1. The bilateral common and internal carotid arteries are patent within the neck without stenosis 2. The vertebral arteries are patent within the neck bilaterally. 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 However, this apparent stenosis may be accentuated by vessel tortuosity and motion artifact. 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 CT perfusion head: There is significant motion degradation which limits the reliability of the perfusion data. The perfusion software identifies a 15 mL region of critically hypoperfused parenchyma utilizing the Tmax>6 seconds threshold. This hypoperfusion is predominantly located within the left frontoparietal lobes. Reported mismatch volume 15 mL. Electronically Signed   By: Kellie Simmering DO   On: 06/19/2020 14:39   CT HEAD CODE STROKE WO CONTRAST  Result Date: 06/19/2020 CLINICAL DATA:  Code stroke. Neuro deficit,  acute, stroke suspected. EXAM: CT HEAD WITHOUT CONTRAST TECHNIQUE: Contiguous axial images were obtained from the base of the skull through the vertex without  intravenous contrast. COMPARISON:  Report from brain MRI and MRA head 09/21/2002 (images unavailable). Head CT 09/21/2002. FINDINGS: Brain: Moderate generalized parenchymal atrophy. There are chronic cortically based infarcts within the right parietal lobe and left temporal occipital lobe. Chronic infarcts within the bilateral basal ganglia. Background moderate ill-defined hypoattenuation within the cerebral white matter which is nonspecific, but consistent with chronic small vessel ischemic disease. Small chronic appearing infarcts within the left cerebellar hemisphere. There is no acute intracranial hemorrhage. No acute demarcated cortical infarct. No extra-axial fluid collection. No evidence of intracranial mass. No midline shift. Vascular: No hyperdense vessel is identified. Right ICA stent. 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 Skull: Normal. Negative for fracture or focal lesion. Sinuses/Orbits: Visualized orbits show no acute finding. No significant paranasal sinus disease or mastoid effusion at the imaged levels ASPECTS [facility] Stroke Program Early CT Score) - Ganglionic level infarction (caudate, lentiform nuclei, internal capsule, insula, M1-M3 cortex): 7 - Supraganglionic infarction (M4-M6 cortex): 3 Total score (0-10 with 10 being normal): 10 (when discounting chronic infarcts) These results were communicated to Dr. Rory Percy At 2:09 pmon 9/12/2021by text page via the [facility] messaging system. IMPRESSION: No CT evidence of acute intracranial abnormality. ASPECTS is 10 (when discounting chronic infarcts). 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 Chronic lacunar infarcts within the bilateral basal ganglia. Small chronic appearing infarcts within the left cerebellar hemisphere.  Background moderate generalized parenchymal atrophy and chronic small vessel ischemic disease. 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 Electronically Signed   By: Kellie Simmering DO   On: 06/19/2020 14:09    PHYSICAL EXAM Frail pleasant elderly African-American lady not in distress. . Afebrile. Head is nontraumatic. Neck is supple without bruit.    Cardiac exam no murmur or gallop. Lungs are clear to auscultation. Distal pulses are well felt. Neurological Exam ;  Awake  Alert oriented x 3. Normal speech and language.eye movements full without nystagmus.fundi were not visualized. Vision acuity and fields appear normal. Hearing is normal. Palatal movements are normal. Face symmetric. Tongue midline. Normal strength, tone, reflexes and coordination. Normal sensation. Gait deferred. ASSESSMENT/PLAN Pamela Hess is a 74 y.o. female with history of hypertension hyperlipidemia, cerebral aneurysm status post coiling in early 2000, diabetes, CKD, breast cancer presenting with expressive aphasia.   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 Chronic R parietal and L temporal occipital lobe cortical infarcts. Chronic B basal ganglia lacunes. Chronic L cerebellar infarcts. Moderate Small vessel disease. Atrophy. Prior R paraclinoid ICA aneurysm tx w/ coiling and stent. ASPECTS 10.     CTA head inferior division L M2 occlusion   CTA neck proximal R V1 moderate / severe stenosis accentuated by tortuosity  CT perfusion L frontoparietal 48mL penumbra w/ 25ml mismatch  MRI  Canceled (pt w/ intracranial R ICA coiling and stent in April 2004 using [place], discussed w/ Lars Pinks neurorad, unsure if MRI compatible or not, unlikely to cause pt harm, however, would prefer to avoid if possible)  Repeat CT w/w/o contrast pending   2D Echo pending   LDL 53  HgbA1c 6.2  VTE  prophylaxis - Lovenox 40 mg sq daily   No antithrombotic prior to admission, now on aspirin 325 mg daily. Decrease aspirin to 81 and add plavix x 3 weeks, then aspirin alone   Therapy recommendations:  No PT, OP SLP  Disposition:  Return home  Hypertension  Stable . Permissive hypertension (OK if < 220/120) but gradually normalize in 5-7 days . 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 Arne Cleveland, Andreas [place], [place] Reece 01/2003 - (discussed w/ Lars Pinks neurorad, unsure if MRI compatible or not, unlikely to cause pt harm, however, would prefer to avoid if possible)  Other Stroke Risk Factors  Advanced age  Cigarette smoker, advised to stop smoking  Hx stroke/TIA  09/2002 - L parietal infarct presenting w/ aphasia felt to be d/t HTN and elevated homocysteine. Incidental R ICA aneurysm found during that admission. Placed on aspirin, BP meds and foltx. D/t CIR. Plans for OP angio following rehab stay.  Other Active Problems  Hx breast cancer  Hospital day # 1 She presented with transient confusion and speech word finding difficulties possible left hemispheric TIA versus unwitnessed seizure with postictal confusion.  Recommend check EEG and repeat CT head with and without contrast as MRI cannot be obtained due to unknown status of patient's intracranial aneurysm coiling in the past.  Long discussion with patient and Dr. Lucianne Lei and answered questions.  Greater than 50% time during the 35-minute visit was spent in counseling and coordination of care about her episode of confusion and TIA versus seizure and answering questions Antony Contras, MD To contact Stroke Continuity provider, please refer to [email or web address] After hours, contact General  Neurology

## 2020-06-20 NOTE — Progress Notes (Signed)
Progress Note    Pamela Hess [place]  IOX:[id number] DOB: 28-Dec-1945  DOA: 06/19/2020 PCP: Nolene Ebbs, MD    Brief Narrative:     Medical records reviewed and are as summarized below:  Pamela Hess is an 74 y.o. female with medical history significant of hypertension, hyperlipidemia, CKD stage III, right breast cancer status post mastectomy, tobacco abuse, mild intermittent asthma presents to emergency department for evaluation of aphasia.  Apparently patient was at home this morning and daughter came to visit her and found that patient was not behaving like herself, she had confusion expressing self and finding the words.     Assessment/Plan:   Principal Problem:   Ischemic stroke [facility]) Active Problems:   Hypertension   Breast cancer of upper-outer quadrant of right female breast ([place])   Hyperlipidemia   Acute on chronic kidney failure (HCC)   Aphasia   Tobacco abuse   Ischemic stroke: -Patient presented with word finding difficulty.  R -She is outside of window for IV TPA and endovascular thrombectomy due to distal location of the clot. -Allow for permissive hypertension for the first 24-48h - only treat PRN if SBP >268 mmHg or diastolic blood pressure >341. Blood pressures can be gradually normalized to SBP<140 upon discharge. - MRI brain has been held until more information obtained about patient's stent - transthoracic echo -LDL: 53, A1C: 6.2 -Start on aspirin, continue atorvastatin -neurology consult appreciated -PT/OT/SLP: outpatient SLP  AKI on CKD stage IIIa: -improving -avoid nephrotoxic agents  Hypertension: -Hold amlodipine and enalapril to allow permissive hypertension for now  Hyperlipidemia: LDL 53 -Continue home statin  Type 2 diabetes mellitus:  -SSI and diabetic diet -A1C: 6.2  Diabetic neuropathy:  -Continue gabapentin  Mild intermittent asthma:  -Continue proair as needed  Tobacco abuse:  -encouraged  cessation  History of right upper quadrant breast cancer: Status post mastectomy   Family Communication/Anticipated D/C date and plan/Code Status   DVT prophylaxis: Lovenox ordered. Code Status: Full Code.  Disposition Plan: Status is: Inpatient  Remains inpatient appropriate because:IV treatments appropriate due to intensity of illness or inability to take PO   Dispo: The patient is from: Home              Anticipated d/c is to: Home              Anticipated d/c date is: 1 day              Patient currently is not medically stable to d/c.         Medical Consultants:    neurology     Subjective:   No SOB, no CP, some difficulty finding words  Objective:    Vitals:   06/19/20 1500 06/19/20 1645 06/19/20 1900 06/20/20 0546  BP: (!) 143/70  (!) 171/88 (!) 145/83  Pulse: (!) 50 (!) 56 (!) 55 (!) 59  Resp: 18 14 16 16   Temp:   97.7 F (36.5 C) 98 F (36.7 C)  TempSrc:   Oral Oral  SpO2: 98% 99% 100% 98%  Weight:        Intake/Output Summary (Last 24 hours) at 06/20/2020 1005 Last data filed at 06/20/2020 0800 Gross per 24 hour  Intake 30 ml  Output --  Net 30 ml   Filed Weights   06/19/20 1414  Weight: 63.6 kg    Exam:  General: Appearance:    Well developed, well nourished female in no acute distress     Lungs:  respirations unlabored  Heart:    Normal heart rate. Normal rhythm. No murmurs, rubs, or gallops.   MS:   All extremities are intact.   Neurologic:   Difficulty with words     Data Reviewed:   I have personally reviewed following labs and imaging studies:  Labs: Labs show the following:   Basic Metabolic Panel: Recent Labs  Lab 06/19/20 1351 06/19/20 1351 06/19/20 1353 06/20/20 0318  NA 139  --  141 141  K 3.9   < > 3.8 3.6  CL 106  --  106 107  CO2 20*  --   --  26  GLUCOSE 119*  --  115* 103*  BUN 11  --  13 9  CREATININE 1.45*  --  1.30* 1.37*  CALCIUM 9.1  --   --  8.7*   < > = values in this interval not  displayed.   GFR CrCl cannot be calculated (Unknown ideal weight.). Liver Function Tests: Recent Labs  Lab 06/19/20 1351  AST 16  ALT 12  ALKPHOS 95  BILITOT 0.6  PROT 7.1  ALBUMIN 3.7   No results for input(s): LIPASE, AMYLASE in the last 168 hours. No results for input(s): AMMONIA in the last 168 hours. Coagulation profile Recent Labs  Lab 06/19/20 1351  INR 1.0    CBC: Recent Labs  Lab 06/19/20 1351 06/19/20 1353 06/20/20 0318  WBC 8.5  --  8.6  NEUTROABS 3.8  --  4.1  HGB 13.8 14.6 12.5  HCT 43.0 43.0 39.0  MCV 92.3  --  93.1  PLT 211  --  203   Cardiac Enzymes: No results for input(s): CKTOTAL, CKMB, CKMBINDEX, TROPONINI in the last 168 hours. BNP (last 3 results) No results for input(s): PROBNP in the last 8760 hours. CBG: No results for input(s): GLUCAP in the last 168 hours. D-Dimer: No results for input(s): DDIMER in the last 72 hours. Hgb A1c: Recent Labs    06/20/20 0318  HGBA1C 6.2*   Lipid Profile: Recent Labs    06/20/20 0318  CHOL 112  HDL 33*  LDLCALC 53  TRIG 129  CHOLHDL 3.4   Thyroid function studies: No results for input(s): TSH, T4TOTAL, T3FREE, THYROIDAB in the last 72 hours.  Invalid input(s): FREET3 Anemia work up: No results for input(s): VITAMINB12, FOLATE, FERRITIN, TIBC, IRON, RETICCTPCT in the last 72 hours. Sepsis Labs: Recent Labs  Lab 06/19/20 1351 06/20/20 0318  WBC 8.5 8.6    Microbiology Recent Results (from the past 240 hour(s))  SARS Coronavirus 2 by RT PCR (hospital order, performed in [facility] hospital lab) Nasopharyngeal Nasopharyngeal Swab     Status: None   Collection Time: 06/19/20  3:14 PM   Specimen: Nasopharyngeal Swab  Result Value Ref Range Status   SARS Coronavirus 2 NEGATIVE NEGATIVE Final    Comment: (NOTE) SARS-CoV-2 target nucleic acids are NOT DETECTED.  The SARS-CoV-2 RNA is generally detectable in upper and lower respiratory specimens during the acute phase of infection.  The lowest concentration of SARS-CoV-2 viral copies this assay can detect is 250 copies / mL. A negative result does not preclude SARS-CoV-2 infection and should not be used as the sole basis for treatment or other patient management decisions.  A negative result may occur with improper specimen collection / handling, submission of specimen other than nasopharyngeal swab, presence of viral mutation(s) within the areas targeted by this assay, and inadequate number of viral copies (<250 copies / mL). A negative result  must be combined with clinical observations, patient history, and epidemiological information.  Fact Sheet for Patients:   StrictlyIdeas.no  Fact Sheet for Healthcare Providers: BankingDealers.co.za  This test is not yet approved or  cleared by the [place] FDA and has been authorized for detection and/or diagnosis of SARS-CoV-2 by FDA under an Emergency Use Authorization (EUA).  This EUA will remain in effect (meaning this test can be used) for the duration of the COVID-19 declaration under Section 564(b)(1) of the Act, 21 U.S.C. section 360bbb-3(b)(1), unless the authorization is terminated or revoked sooner.  Performed at [facility], [place] [address], [place], [place] [address]     Procedures and diagnostic studies:  CT Code Stroke CTA Head W/WO contrast  Result Date: 06/19/2020 CLINICAL DATA:  Neuro deficit, acute, stroke suspected. Additional history provided: Aphasia. EXAM: CT ANGIOGRAPHY HEAD AND NECK CT PERFUSION BRAIN TECHNIQUE: Multidetector CT imaging of the head and neck was performed using the standard protocol during bolus administration of intravenous contrast. Multiplanar CT image reconstructions and MIPs were obtained to evaluate the vascular anatomy. Carotid stenosis measurements (when applicable) are obtained utilizing NASCET criteria, using the distal internal carotid diameter as the  denominator. Multiphase CT imaging of the brain was performed following IV bolus contrast injection. Subsequent parametric perfusion maps were calculated using RAPID software. CONTRAST:  Administered contrast not known at this time. COMPARISON:  Noncontrast head CT performed earlier the same day 06/19/2020. FINDINGS: CTA NECK FINDINGS The examination is mildly motion degraded. Aortic arch: Standard aortic branching. Atherosclerotic plaque within the visualized aortic arch and proximal major branch vessels of the neck. Right carotid system: CCA and ICA patent within the neck without stenosis. Minimal mixed plaque within carotid bifurcation. Left carotid system: See seen ICA patent within the neck without stenosis. Minimal mixed plaque within the carotid bifurcation and proximal ICA. Vertebral arteries: The vertebral arteries are patent within the neck. 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 However, this may be accentuated by vessel tortuosity and motion artifact Skeleton: No acute bony abnormality or aggressive osseous lesion. Cervical spondylosis with multilevel disc space narrowing, posterior disc osteophytes, uncovertebral and facet hypertrophy. Other neck: No neck mass or cervical lymphadenopathy. Multiple thyroid nodules measuring up to 14 mm, not meeting consensus criteria for ultrasound follow-up. Upper chest: No consolidation within the imaged lung apices. No visible pneumothorax. Review of the MIP images confirms the above findings CTA HEAD FINDINGS Anterior circulation: The intracranial internal carotid arteries are patent. A stent is present within the right ICA extending from the pre cavernous to the supraclinoid segment. 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 The M1 middle cerebral arteries  are patent without significant stenosis. 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 The anterior cerebral arteries are patent. Posterior circulation: The intracranial vertebral arteries are patent. The basilar artery is patent. The posterior cerebral arteries are patent proximally without significant stenosis. Posterior communicating arteries are hypoplastic or absent bilaterally. Venous sinuses: Within limitations of contrast timing, no convincing thrombus. Anatomic variants: As described Review of the MIP images confirms the above findings CT Brain Perfusion Findings: There is significant motion degradation, limiting the reliability of the perfusion data. CBF (<30%) Volume: 3mL Perfusion (Tmax>6.0s) volume: 49mL (predominantly within the left frontoparietal lobes) (additional  small foci in the region of the right temporal lobe). Mismatch Volume: 85mL Infarction Location:None identified These results were called by telephone at the time of interpretation on 06/19/2020 at 2:39 pm to provider Dr. Rory Percy, who verbally acknowledged these results. IMPRESSION: CTA neck: 1. The bilateral common and internal carotid arteries are patent within the neck without stenosis 2. The vertebral arteries are patent within the neck bilaterally. 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 However, this apparent stenosis may be accentuated by vessel tortuosity and motion artifact. 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 CT perfusion head: There is significant motion degradation which limits the reliability of the perfusion data. The perfusion software identifies a 15 mL region of critically hypoperfused parenchyma utilizing the Tmax>6 seconds threshold. This hypoperfusion is predominantly located within the left frontoparietal lobes. Reported mismatch volume 15 mL.  Electronically Signed   By: Kellie Simmering DO   On: 06/19/2020 14:39   CT Code Stroke CTA Neck W/WO contrast  Result Date: 06/19/2020 CLINICAL DATA:  Neuro deficit, acute, stroke suspected. Additional history provided: Aphasia. EXAM: CT ANGIOGRAPHY HEAD AND NECK CT PERFUSION BRAIN TECHNIQUE: Multidetector CT imaging of the head and neck was performed using the standard protocol during bolus administration of intravenous contrast. Multiplanar CT image reconstructions and MIPs were obtained to evaluate the vascular anatomy. Carotid stenosis measurements (when applicable) are obtained utilizing NASCET criteria, using the distal internal carotid diameter as the denominator. Multiphase CT imaging of the brain was performed following IV bolus contrast injection. Subsequent parametric perfusion maps were calculated using RAPID software. CONTRAST:  Administered contrast not known at this time. COMPARISON:  Noncontrast head CT performed earlier the same day 06/19/2020. FINDINGS: CTA NECK FINDINGS The examination is mildly motion degraded. Aortic arch: Standard aortic branching. Atherosclerotic plaque within the visualized aortic arch and proximal major branch vessels of the neck. Right carotid system: CCA and ICA patent within the neck without stenosis. Minimal mixed plaque within carotid bifurcation. Left carotid system: See seen ICA patent within the neck without stenosis. Minimal mixed plaque within the carotid bifurcation and proximal ICA. Vertebral arteries: The vertebral arteries are patent within the neck. 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 However, this may be accentuated by vessel tortuosity and motion artifact Skeleton: No acute bony abnormality or aggressive osseous lesion. Cervical spondylosis with multilevel disc space narrowing, posterior disc osteophytes, uncovertebral and facet hypertrophy. Other neck: No neck mass or cervical lymphadenopathy. Multiple thyroid  nodules measuring up to 14 mm, not meeting consensus criteria for ultrasound follow-up. Upper chest: No consolidation within the imaged lung apices. No visible pneumothorax. Review of the MIP images confirms the above findings CTA HEAD FINDINGS Anterior circulation: The intracranial internal carotid arteries are patent. A stent is present within the right ICA extending from the pre cavernous to the supraclinoid segment. 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 The M1 middle cerebral arteries are patent without significant stenosis. 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 The anterior cerebral arteries are patent. Posterior circulation: The intracranial vertebral arteries are patent. The basilar artery is patent. The posterior cerebral arteries are patent proximally without significant stenosis. Posterior communicating arteries are hypoplastic or absent bilaterally. Venous sinuses: Within limitations  of contrast timing, no convincing thrombus. Anatomic variants: As described Review of the MIP images confirms the above findings CT Brain Perfusion Findings: There is significant motion degradation, limiting the reliability of the perfusion data. CBF (<30%) Volume: 72mL Perfusion (Tmax>6.0s) volume: 31mL (predominantly within the left frontoparietal lobes) (additional small foci in the region of the right temporal lobe). Mismatch Volume: 40mL Infarction Location:None identified These results were called by telephone at the time of interpretation on 06/19/2020 at 2:39 pm to provider Dr. Rory Percy, who verbally acknowledged these results. IMPRESSION: CTA neck: 1. The bilateral common and internal carotid arteries are patent within the neck without stenosis 2. The  vertebral arteries are patent within the neck bilaterally. 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 However, this apparent stenosis may be accentuated by vessel tortuosity and motion artifact. 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 CT perfusion head: There is significant motion degradation which limits the reliability of the perfusion data. The perfusion software identifies a 15 mL region of critically hypoperfused parenchyma utilizing the Tmax>6 seconds threshold. This hypoperfusion is predominantly located within the left frontoparietal lobes. Reported mismatch volume 15 mL. Electronically Signed   By: Kellie Simmering DO   On: 06/19/2020 14:39   CT Code Stroke Cerebral Perfusion with contrast  Result Date: 06/19/2020 CLINICAL DATA:  Neuro deficit, acute, stroke suspected. Additional history provided: Aphasia. EXAM: CT ANGIOGRAPHY HEAD AND NECK CT PERFUSION BRAIN TECHNIQUE: Multidetector CT imaging of the head and neck was performed using the standard protocol during bolus administration of intravenous contrast. Multiplanar CT image reconstructions and MIPs were obtained to evaluate the vascular anatomy. Carotid stenosis measurements (when applicable) are obtained utilizing NASCET criteria, using the distal internal carotid diameter as the denominator. Multiphase CT imaging of the brain was performed following IV bolus contrast injection. Subsequent parametric perfusion maps were calculated using RAPID software. CONTRAST:  Administered contrast not known at this time. COMPARISON:  Noncontrast head CT performed earlier the same day 06/19/2020. FINDINGS: CTA NECK FINDINGS The examination is mildly motion degraded. Aortic arch: Standard aortic branching. Atherosclerotic plaque within the visualized aortic arch and proximal major branch vessels of the neck. Right carotid system: CCA and ICA patent within the neck without stenosis.  Minimal mixed plaque within carotid bifurcation. Left carotid system: See seen ICA patent within the neck without stenosis. Minimal mixed plaque within the carotid bifurcation and proximal ICA. Vertebral arteries: The vertebral arteries are patent within the neck. 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 However, this may be accentuated by vessel tortuosity and motion artifact Skeleton: No acute bony abnormality or aggressive osseous lesion. Cervical spondylosis with multilevel disc space narrowing, posterior disc osteophytes, uncovertebral and facet hypertrophy. Other neck: No neck mass or cervical lymphadenopathy. Multiple thyroid nodules measuring up to 14 mm, not meeting consensus criteria for ultrasound follow-up. Upper chest: No consolidation within the imaged lung apices. No visible pneumothorax. Review of the MIP images confirms the above findings CTA HEAD FINDINGS Anterior circulation: The intracranial internal carotid arteries are patent. A stent is present within the right ICA extending from the pre cavernous to the supraclinoid segment. 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 The M1 middle cerebral arteries are patent without significant stenosis. There is abrupt occlusion of an inferior division mid M2 left MCA branch vessel (  series 13, image 29) (series 8, images 90 1-89). The anterior cerebral arteries are patent. Posterior circulation: The intracranial vertebral arteries are patent. The basilar artery is patent. The posterior cerebral arteries are patent proximally without significant stenosis. Posterior communicating arteries are hypoplastic or absent bilaterally. Venous sinuses: Within limitations of contrast timing, no convincing thrombus. Anatomic variants: As described Review of the  MIP images confirms the above findings CT Brain Perfusion Findings: There is significant motion degradation, limiting the reliability of the perfusion data. CBF (<30%) Volume: 25mL Perfusion (Tmax>6.0s) volume: 73mL (predominantly within the left frontoparietal lobes) (additional small foci in the region of the right temporal lobe). Mismatch Volume: 85mL Infarction Location:None identified These results were called by telephone at the time of interpretation on 06/19/2020 at 2:39 pm to provider Dr. Rory Percy, who verbally acknowledged these results. IMPRESSION: CTA neck: 1. The bilateral common and internal carotid arteries are patent within the neck without stenosis 2. The vertebral arteries are patent within the neck bilaterally. 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 However, this apparent stenosis may be accentuated by vessel tortuosity and motion artifact. 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 CT perfusion head: There is significant motion degradation which limits the reliability of the perfusion data. The perfusion software identifies a 15 mL region of critically hypoperfused parenchyma utilizing the Tmax>6 seconds threshold. This hypoperfusion is predominantly located within the left frontoparietal lobes. Reported mismatch volume 15 mL. Electronically Signed   By: Kellie Simmering DO   On: 06/19/2020 14:39   CT HEAD CODE STROKE WO CONTRAST  Result Date: 06/19/2020 CLINICAL DATA:  Code stroke. Neuro deficit, acute, stroke suspected. EXAM: CT HEAD WITHOUT CONTRAST TECHNIQUE: Contiguous axial images were obtained from the base of the skull through the vertex without intravenous contrast. COMPARISON:  Report from brain MRI and MRA head 09/21/2002 (images unavailable). Head CT 09/21/2002. FINDINGS: Brain: Moderate generalized parenchymal atrophy. There are chronic cortically based infarcts within the right parietal lobe and left  temporal occipital lobe. Chronic infarcts within the bilateral basal ganglia. Background moderate ill-defined hypoattenuation within the cerebral white matter which is nonspecific, but consistent with chronic small vessel ischemic disease. Small chronic appearing infarcts within the left cerebellar hemisphere. There is no acute intracranial hemorrhage. No acute demarcated cortical infarct. No extra-axial fluid collection. No evidence of intracranial mass. No midline shift. Vascular: No hyperdense vessel is identified. Right ICA stent. 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 Skull: Normal. Negative for fracture or focal lesion. Sinuses/Orbits: Visualized orbits show no acute finding. No significant paranasal sinus disease or mastoid effusion at the imaged levels ASPECTS [facility] Stroke Program Early CT Score) - Ganglionic level infarction (caudate, lentiform nuclei, internal capsule, insula, M1-M3 cortex): 7 - Supraganglionic infarction (M4-M6 cortex): 3 Total score (0-10 with 10 being normal): 10 (when discounting chronic infarcts) These results were communicated to Dr. Rory Percy At 2:09 pmon 9/12/2021by text page via the [facility] messaging system. IMPRESSION: No CT evidence of acute intracranial abnormality. ASPECTS is 10 (when discounting chronic infarcts). 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 Chronic lacunar infarcts within the bilateral basal ganglia. Small chronic appearing infarcts within the left cerebellar hemisphere. Background moderate generalized parenchymal atrophy and chronic small vessel ischemic disease. 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 Electronically Signed   By: Kellie Simmering DO   On: 06/19/2020 14:09    Medications:   .  stroke: mapping our early stages of recovery book   Does not apply Once  . aspirin  300 mg Rectal Daily   Or  . aspirin  325 mg Oral Daily  . atorvastatin  20 mg Oral  QHS  . enoxaparin (LOVENOX) injection  40 mg Subcutaneous q1800  . folic acid  1 mg Oral Daily  . gabapentin  300 mg Oral TID  . insulin aspart  0-5 Units Subcutaneous QHS  . insulin aspart  0-9 Units Subcutaneous TID WC  . mometasone-formoterol  2 puff Inhalation BID  . nortriptyline  25 mg Oral QHS  . Vitamin D (Ergocalciferol)  50,000 Units Oral Weekly   Continuous Infusions:   LOS: 1 day   Geradine Girt  [facility]   How to contact the [facility] Attending or Consulting provider [place] or covering provider during after hours [place], for this patient?  1. Check the care team in [facility] and look for a) attending/consulting [facility] provider listed and b) the [facility] team listed 2. Log into [email or web address] and use [place]'s universal password to access. If you do not have the password, please contact the hospital operator. 3. Locate the [facility] provider you are looking for under [facility] and page to a number that you can be directly reached. 4. If you still have difficulty reaching the provider, please page the [facility] (Director on Call) for the Hospitalists listed on amion for assistance.  06/20/2020, 10:05 AM

## 2020-06-20 NOTE — Progress Notes (Signed)
OT Cancellation Note  Patient Details Name: Pamela Hess MRN: [id number] DOB: 23-Apr-1946   Cancelled Treatment:    Reason Eval/Treat Not Completed: Patient at procedure or test/ unavailable;Other (comment) (with Speech Therapist). Plan to reattempt.  Tyrone Schimke, OT Acute Rehabilitation Services Pager: [phone] Office: [phone]  06/20/2020, 9:41 AM

## 2020-06-20 NOTE — Procedures (Signed)
Patient Name: Pamela Hess  MRN: [id number]  Epilepsy Attending: Lora Havens  Referring Physician/Provider: Burnetta Sabin, NP Date: 06/20/2020 Duration: 24.14 mins  Patient history: 74yo F presenting with expressive aphasia.  EEG to evaluate for seizure.   Level of alertness: Awake, asleep  AEDs during EEG study: Gabapentin  Technical aspects: This EEG study was done with scalp electrodes positioned according to the 10-20 International system of electrode placement. Electrical activity was acquired at a sampling rate of 500Hz  and reviewed with a high frequency filter of 70Hz  and a low frequency filter of 1Hz . EEG data were recorded continuously and digitally stored.   Description: The posterior dominant rhythm consists of 8-9 Hz activity of moderate voltage (25-35 uV) seen predominantly in posterior head regions, symmetric and reactive to eye opening and eye closing. Sleep was characterized by vertex waves, sleep spindles (12 to 14 Hz), maximal frontocentral region. EEG showed intermittent 3 to 6 Hz theta-delta slowing in left temporal region. Spikes were also seen in left temporal region, maximal T7. Hyperventilation and photic stimulation were not performed.     ABNORMALITY - Intermittent slow, left temporal region - Spike, left temporal region  IMPRESSION: This study showed evidence of potential epileptogenicity as well as cortical dysfunction arising from left temporal region likely secondary to underlying stroke. No seizures were seen throughout the recording.  Aliece Honold Barbra Sarks

## 2020-06-20 NOTE — Progress Notes (Signed)
OT Cancellation Note  Patient Details Name: Pamela Hess MRN: [id number] DOB: Jan 24, 1946   Cancelled Treatment:    Reason Eval/Treat Not Completed: OT screened, no needs identified, will sign off. Per chart review, pt presenting with aphasia, walked 200' and completed stairs with PT this morning. Appears to be at baseline, mod I, with ADLs.  No acute OT needs identified.   Hortencia Pilar 06/20/2020, 11:26 AM

## 2020-06-21 ENCOUNTER — Inpatient Hospital Stay (HOSPITAL_COMMUNITY): Payer: Medicare Other

## 2020-06-21 DIAGNOSIS — R569 Unspecified convulsions: Secondary | ICD-10-CM

## 2020-06-21 LAB — BASIC METABOLIC PANEL
Anion gap: 9 (ref 5–15)
BUN: 14 mg/dL (ref 8–23)
CO2: 26 mmol/L (ref 22–32)
Calcium: 9 mg/dL (ref 8.9–10.3)
Chloride: 106 mmol/L (ref 98–111)
Creatinine, Ser: 1.38 mg/dL — ABNORMAL HIGH (ref 0.44–1.00)
GFR calc Af Amer: 44 mL/min — ABNORMAL LOW (ref >60)
GFR calc non Af Amer: 38 mL/min — ABNORMAL LOW (ref >60)
Glucose, Bld: 107 mg/dL — ABNORMAL HIGH (ref 70–99)
Potassium: 3.8 mmol/L (ref 3.5–5.1)
Sodium: 141 mmol/L (ref 135–145)

## 2020-06-21 LAB — GLUCOSE, CAPILLARY
Glucose-Capillary: 80 mg/dL (ref 70–99)
Glucose-Capillary: 129 mg/dL — ABNORMAL HIGH (ref 70–99)

## 2020-06-21 MED ORDER — FLUTICASONE-SALMETEROL 250-50 MCG/DOSE IN AEPB: 1.0000 | INHALATION_SPRAY | Freq: Every day | RESPIRATORY_TRACT | Status: AC | PRN

## 2020-06-21 MED ORDER — ASPIRIN 81 MG PO TBEC: 81.0000 mg | DELAYED_RELEASE_TABLET | Freq: Every day | ORAL | 11 refills | Status: AC

## 2020-06-21 MED ORDER — LEVETIRACETAM 500 MG PO TABS: 500.0000 mg | ORAL_TABLET | Freq: Two times a day (BID) | ORAL | 1 refills | Status: AC

## 2020-06-21 NOTE — Progress Notes (Signed)
Discharge instructions reviewed with pt and her son. (son went off the unit while pt got dressed, reviewed instructions with both when he returned at this time of 1440).   Copy of instructions given to pt,  Informed script sent to her pharmacy to be picked up. Handout provided on seizures.   Pt d/c'd via wheelchair with belongings, with son.          Escorted by unit NT.

## 2020-06-21 NOTE — Progress Notes (Signed)
STROKE TEAM PROGRESS NOTE   INTERVAL HISTORY Patient reports confusion yesterday. She doesn't remember much. She doesn't remember having difficulty speaking.  I have personally reviewed history of presenting illness, electronic medical records and imaging films in PACS.  CT head is unremarkable.  MRI has not been done since patient has remote history of intracranial and rhythm coiling but MRI come back ability of the coil is not been determined  Vitals:   06/20/20 2300 06/21/20 0626 06/21/20 0810 06/21/20 1229  BP: 138/76 137/73  128/73  Pulse: 87 (!) 56  61  Resp: 17 16  16   Temp: 98.5 F (36.9 C) (!) 97.5 F (36.4 C)  98.4 F (36.9 C)  TempSrc: Oral Oral  Oral  SpO2: 98% 91% 93% 96%  Weight:       CBC:  Recent Labs  Lab 06/19/20 1351 06/19/20 1351 06/19/20 1353 06/20/20 0318  WBC 8.5  --   --  8.6  NEUTROABS 3.8  --   --  4.1  HGB 13.8   < > 14.6 12.5  HCT 43.0   < > 43.0 39.0  MCV 92.3  --   --  93.1  PLT 211  --   --  203   < > = values in this interval not displayed.   Basic Metabolic Panel:  Recent Labs  Lab 06/20/20 0318 06/21/20 0807  NA 141 141  K 3.6 3.8  CL 107 106  CO2 26 26  GLUCOSE 103* 107*  BUN 9 14  CREATININE 1.37* 1.38*  CALCIUM 8.7* 9.0   Lipid Panel:  Recent Labs  Lab 06/20/20 0318  CHOL 112  TRIG 129  HDL 33*  CHOLHDL 3.4  VLDL 26  LDLCALC 53   HgbA1c:  Recent Labs  Lab 06/20/20 0318  HGBA1C 6.2*   Urine Drug Screen: No results for input(s): LABOPIA, COCAINSCRNUR, LABBENZ, AMPHETMU, THCU, LABBARB in the last 168 hours.  Alcohol Level No results for input(s): ETH in the last 168 hours.  IMAGING past 24 hours CT HEAD WO CONTRAST  Result Date: 06/21/2020 CLINICAL DATA:  Stroke follow-up EXAM: CT HEAD WITHOUT CONTRAST TECHNIQUE: Contiguous axial images were obtained from the base of the skull through the vertex without intravenous contrast. COMPARISON:  None. FINDINGS: Brain: There is no mass, hemorrhage or extra-axial  collection. The size and configuration of the ventricles and extra-axial CSF spaces are normal. There is hypoattenuation of the white matter, most commonly indicating chronic small vessel disease. There are old infarcts of the left cerebellum, left basal ganglia and right parietal lobe. Vascular: No abnormal hyperdensity of the major intracranial arteries or dural venous sinuses. No intracranial atherosclerosis. Skull: The visualized skull base, calvarium and extracranial soft tissues are normal. Sinuses/Orbits: No fluid levels or advanced mucosal thickening of the visualized paranasal sinuses. No mastoid or middle ear effusion. The orbits are normal. IMPRESSION: 1. No acute intracranial abnormality. 2. 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 3. Chronic small vessel disease. Electronically Signed   By: Ulyses Jarred M.D.   On: 06/21/2020 01:17   EEG adult  Result Date: 06/20/2020 Lora Havens, MD     06/20/2020  5:23 PM Patient Name: Pamela Hess MRN: [id number] Epilepsy Attending: Lora Havens Referring Physician/Provider: Burnetta Sabin, NP Date: 06/20/2020 Duration: 24.14 mins Patient history: 74yo F presenting with expressive aphasia.  EEG to evaluate for seizure. Level of alertness: Awake, asleep AEDs during EEG study: Gabapentin Technical aspects: This EEG  study was done with scalp electrodes positioned according to the 10-20 International system of electrode placement. Electrical activity was acquired at a sampling rate of 500Hz  and reviewed with a high frequency filter of 70Hz  and a low frequency filter of 1Hz . EEG data were recorded continuously and digitally stored. Description: The posterior dominant rhythm consists of 8-9 Hz activity of moderate voltage (25-35 uV) seen predominantly in posterior head regions, symmetric and reactive to eye opening and eye closing. Sleep was characterized by vertex waves, sleep spindles (12 to 14 Hz), maximal  frontocentral region. EEG showed intermittent 3 to 6 Hz theta-delta slowing in left temporal region. Spikes were also seen in left temporal region, maximal T7. Hyperventilation and photic stimulation were not performed.   ABNORMALITY - Intermittent slow, left temporal region - Spike, left temporal region IMPRESSION: This study showed evidence of potential epileptogenicity as well as cortical dysfunction arising from left temporal region likely secondary to underlying stroke. No seizures were seen throughout the recording. [place]    PHYSICAL EXAM Frail pleasant elderly African-American lady not in distress. . Afebrile. Head is nontraumatic. Neck is supple without bruit.    Cardiac exam no murmur or gallop. Lungs are clear to auscultation. Distal pulses are well felt. Neurological Exam ;  Awake  Alert oriented x 3. Normal speech and language.eye movements full without nystagmus.fundi were not visualized. Vision acuity and fields appear normal. Hearing is normal. Palatal movements are normal. Face symmetric. Tongue midline. Normal strength, tone, reflexes and coordination. Normal sensation. Gait deferred. ASSESSMENT/PLAN Pamela Hess is a 74 y.o. female with history of hypertension hyperlipidemia, cerebral aneurysm status post coiling in early 2000, diabetes, CKD, breast cancer presenting with expressive aphasia.   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 Chronic R parietal and L temporal occipital lobe cortical infarcts. Chronic B basal ganglia lacunes. Chronic L cerebellar infarcts. Moderate Small vessel disease. Atrophy. Prior R paraclinoid ICA aneurysm tx w/ coiling and stent. ASPECTS 10.     CTA head inferior division L M2 occlusion   CTA neck proximal R V1 moderate / severe stenosis accentuated by tortuosity  CT perfusion L frontoparietal 36mL penumbra w/ 56ml  mismatch  MRI  Canceled (pt w/ intracranial R ICA coiling and stent in April 2004 using [place], discussed w/ Lars Pinks neurorad, unsure if MRI compatible or not, unlikely to cause pt harm, however, would prefer to avoid if possible)  Repeat CT w/w/o contrast pending   2D Echo pending   LDL 53  HgbA1c 6.2  VTE prophylaxis - Lovenox 40 mg sq daily   No antithrombotic prior to admission, now on aspirin 325 mg daily. Decrease aspirin to 81 and add plavix x 3 weeks, then aspirin alone   Therapy recommendations:  No PT, OP SLP  Disposition:  Return home  Hypertension  Stable . Permissive hypertension (OK if < 220/120) but gradually normalize in 5-7 days . 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 Arne Cleveland, Andreas [place], [place] Reece 01/2003 - (discussed w/ Lars Pinks neurorad, unsure if MRI compatible or not, unlikely to cause pt harm,  however, would prefer to avoid if possible)  Other Stroke Risk Factors  Advanced age  Cigarette smoker, advised to stop smoking  Hx stroke/TIA  09/2002 - L parietal infarct presenting w/ aphasia felt to be d/t HTN and elevated homocysteine. Incidental R ICA aneurysm found during that admission. Placed on aspirin, BP meds and foltx. D/t CIR. Plans for OP angio following rehab stay.  Other Active Problems  Hx breast cancer  Hospital day # 2 She presented with transient confusion and speech word finding difficulties likely from unwitnessed seizure with postictal confusion.  Recommend Keppra XR 500 mg daily long-term for seizure prophylaxis.  Aspirin 81 mg daily for stroke prevention.  Long discussion with patient and Dr. Eliseo Squires and answered questions.  Greater than 50% time during the 25-minute visit was spent in  counseling and coordination of care about her episode of confusion and TIA versus seizure and answering questions.  Follow-up as an outpatient stroke clinic in 6 weeks.  Stroke team will sign off.  Kindly call for questions Antony Contras, MD To contact Stroke Continuity provider, please refer to [email or web address] After hours, contact General Neurology

## 2020-06-21 NOTE — Discharge Summary (Signed)
Physician Discharge Summary  Pamela Hess [facility] DUK:[id number] DOB: [birth year] DOA: 06/19/2020  PCP: Nolene Ebbs, MD  Admit date: 06/19/2020 Discharge date: 06/21/2020  Admitted From: home Discharge disposition: home   Recommendations for Outpatient Follow-Up:   1. Smoking cessation 2. keppra started for seizures    Discharge Diagnosis:   Active Problems:   Hypertension   Breast cancer of upper-outer quadrant of right female breast ([place])   Hyperlipidemia   Acute on chronic kidney failure (HCC)   Aphasia   Tobacco abuse   Seizure ([place])    Discharge Condition: Improved.  Diet recommendation: Low sodium, heart healthy.    Wound care: None.  Code status: Full.   History of Present Illness:   Pamela Hess is a 74 y.o. female with medical history significant of hypertension, hyperlipidemia, CKD stage III, right breast cancer status post mastectomy, tobacco abuse, mild intermittent asthma presents to emergency department for evaluation of aphasia.  Apparently patient was at home this morning and daughter came to visit her and found that patient was not behaving like herself, she had confusion expressing self and finding the words.  upon my evaluation: Patient tells me that she is not sure why she is in the ER and does not know what is wrong with her.  She denies any symptoms including headache, blurry vision, lightheadedness, dizziness, loss of consciousness, chest pain, shortness of breath, palpitation, numbness weakness tingling sensation in hands or feet, palpitation, leg swelling, fever, chills, nausea, vomiting, abdominal pain, urinary or bowel changes.  She lives alone at home.  Independent on daily life activities.  Uses cane for ambulation at baseline.  Smokes 1 to 2 cigarettes/day however denies alcohol, licit drug use.   Hospital Course by Problem:   Seizure -EEG: This study showed evidence of potential epileptogenicity as well as cortical  dysfunction arising from left temporal region likely secondary to underlying stroke -CT scan x  2 not showing acute CVAs only old -MRI unable to be obtained due to coiling of unknown type -continue ASA  AKI on CKD stage IIIa: -improving -avoid nephrotoxic agents  Hypertension: -Hold amlodipine and enalapril to allow permissive hypertension for now  Hyperlipidemia: LDL 53 -Continue home statin  Type 2 diabetes mellitus:  -SSI and diabetic diet -A1C: 6.2  Diabetic neuropathy:  -Continue gabapentin  Mild intermittent asthma:  -Continue proair as needed  Tobacco abuse:  -encouraged cessation  History of right upper quadrant breast cancer: Status post mastectomy     Medical Consultants:    neurology  Discharge Exam:   Vitals:   06/21/20 0810 06/21/20 1229  BP:  128/73  Pulse:  61  Resp:  16  Temp:  98.4 F (36.9 C)  SpO2: 93% 96%   Vitals:   06/20/20 2300 06/21/20 0626 06/21/20 0810 06/21/20 1229  BP: 138/76 137/73  128/73  Pulse: 87 (!) 56  61  Resp: 17 16  16   Temp: 98.5 F (36.9 C) (!) 97.5 F (36.4 C)  98.4 F (36.9 C)  TempSrc: Oral Oral  Oral  SpO2: 98% 91% 93% 96%  Weight:        General exam: Appears calm and comfortable. Speech clear  The results of significant diagnostics from this hospitalization (including imaging, microbiology, ancillary and laboratory) are listed below for reference.     Procedures and Diagnostic Studies:   CT Code Stroke CTA Head W/WO contrast  Result Date: 06/19/2020 CLINICAL DATA:  Neuro deficit, acute, stroke suspected. Additional history provided:  Aphasia. EXAM: CT ANGIOGRAPHY HEAD AND NECK CT PERFUSION BRAIN TECHNIQUE: Multidetector CT imaging of the head and neck was performed using the standard protocol during bolus administration of intravenous contrast. Multiplanar CT image reconstructions and MIPs were obtained to evaluate the vascular anatomy. Carotid stenosis measurements (when applicable) are  obtained utilizing NASCET criteria, using the distal internal carotid diameter as the denominator. Multiphase CT imaging of the brain was performed following IV bolus contrast injection. Subsequent parametric perfusion maps were calculated using RAPID software. CONTRAST:  Administered contrast not known at this time. COMPARISON:  Noncontrast head CT performed earlier the same day 06/19/2020. FINDINGS: CTA NECK FINDINGS The examination is mildly motion degraded. Aortic arch: Standard aortic branching. Atherosclerotic plaque within the visualized aortic arch and proximal major branch vessels of the neck. Right carotid system: CCA and ICA patent within the neck without stenosis. Minimal mixed plaque within carotid bifurcation. Left carotid system: See seen ICA patent within the neck without stenosis. Minimal mixed plaque within the carotid bifurcation and proximal ICA. Vertebral arteries: The vertebral arteries are patent within the neck. 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 However, this may be accentuated by vessel tortuosity and motion artifact Skeleton: No acute bony abnormality or aggressive osseous lesion. Cervical spondylosis with multilevel disc space narrowing, posterior disc osteophytes, uncovertebral and facet hypertrophy. Other neck: No neck mass or cervical lymphadenopathy. Multiple thyroid nodules measuring up to 14 mm, not meeting consensus criteria for ultrasound follow-up. Upper chest: No consolidation within the imaged lung apices. No visible pneumothorax. Review of the MIP images confirms the above findings CTA HEAD FINDINGS Anterior circulation: The intracranial internal carotid arteries are patent. A stent is present within the right ICA extending from the pre cavernous to the supraclinoid segment. 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 The M1 middle cerebral arteries are patent without significant stenosis. 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 The anterior cerebral arteries are patent. Posterior circulation: The intracranial vertebral arteries are patent. The basilar artery is patent. The posterior cerebral arteries are patent proximally without significant stenosis. Posterior communicating arteries are hypoplastic or absent bilaterally. Venous sinuses: Within limitations of contrast timing, no convincing thrombus. Anatomic variants: As described Review of the MIP images confirms the above findings CT Brain Perfusion Findings: There is significant motion degradation, limiting the reliability of the perfusion data. CBF (<30%) Volume: 73mL Perfusion (Tmax>6.0s) volume: 84mL (predominantly within the left frontoparietal lobes) (additional small foci in the region of the right temporal lobe). Mismatch Volume: 43mL Infarction Location:None identified These results were called by telephone at the time of interpretation on 06/19/2020 at 2:39 pm to provider Dr. Rory Percy, who verbally acknowledged these results. IMPRESSION: CTA neck: 1. The bilateral common and internal carotid arteries are patent within the neck without stenosis 2. The vertebral arteries are patent within the neck bilaterally. 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 However, this apparent stenosis may be accentuated by vessel tortuosity and motion artifact. 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 CT perfusion head: There is significant motion degradation which limits the reliability of the perfusion data. The perfusion software identifies a 15 mL region of critically hypoperfused parenchyma utilizing the Tmax>6 seconds threshold. This hypoperfusion is  predominantly  located within the left frontoparietal lobes. Reported mismatch volume 15 mL. Electronically Signed   By: Kellie Simmering DO   On: 06/19/2020 14:39   CT Code Stroke CTA Neck W/WO contrast  Result Date: 06/19/2020 CLINICAL DATA:  Neuro deficit, acute, stroke suspected. Additional history provided: Aphasia. EXAM: CT ANGIOGRAPHY HEAD AND NECK CT PERFUSION BRAIN TECHNIQUE: Multidetector CT imaging of the head and neck was performed using the standard protocol during bolus administration of intravenous contrast. Multiplanar CT image reconstructions and MIPs were obtained to evaluate the vascular anatomy. Carotid stenosis measurements (when applicable) are obtained utilizing NASCET criteria, using the distal internal carotid diameter as the denominator. Multiphase CT imaging of the brain was performed following IV bolus contrast injection. Subsequent parametric perfusion maps were calculated using RAPID software. CONTRAST:  Administered contrast not known at this time. COMPARISON:  Noncontrast head CT performed earlier the same day 06/19/2020. FINDINGS: CTA NECK FINDINGS The examination is mildly motion degraded. Aortic arch: Standard aortic branching. Atherosclerotic plaque within the visualized aortic arch and proximal major branch vessels of the neck. Right carotid system: CCA and ICA patent within the neck without stenosis. Minimal mixed plaque within carotid bifurcation. Left carotid system: See seen ICA patent within the neck without stenosis. Minimal mixed plaque within the carotid bifurcation and proximal ICA. Vertebral arteries: The vertebral arteries are patent within the neck. 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 However, this may be accentuated by vessel tortuosity and motion artifact Skeleton: No acute bony abnormality or aggressive osseous lesion. Cervical spondylosis with multilevel disc space narrowing, posterior disc osteophytes, uncovertebral and facet  hypertrophy. Other neck: No neck mass or cervical lymphadenopathy. Multiple thyroid nodules measuring up to 14 mm, not meeting consensus criteria for ultrasound follow-up. Upper chest: No consolidation within the imaged lung apices. No visible pneumothorax. Review of the MIP images confirms the above findings CTA HEAD FINDINGS Anterior circulation: The intracranial internal carotid arteries are patent. A stent is present within the right ICA extending from the pre cavernous to the supraclinoid segment. 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 The M1 middle cerebral arteries are patent without significant stenosis. 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 The anterior cerebral arteries are patent. Posterior circulation: The intracranial vertebral arteries are patent. The basilar artery is patent. The posterior cerebral arteries are patent proximally without significant stenosis. Posterior communicating arteries are hypoplastic or absent bilaterally. Venous sinuses: Within limitations of contrast timing, no convincing thrombus. Anatomic variants: As described Review of the MIP images confirms the above findings CT Brain Perfusion Findings: There is significant motion degradation, limiting the reliability of the perfusion data. CBF (<30%) Volume: 52mL Perfusion (Tmax>6.0s) volume: 62mL (predominantly within the left frontoparietal lobes) (additional small foci in the region of the right temporal lobe). Mismatch Volume: 2mL Infarction Location:None identified These results were called by telephone at the time of interpretation on 06/19/2020 at 2:39 pm to provider Dr. Rory Percy, who verbally acknowledged these results. IMPRESSION: CTA neck: 1. The bilateral common and  internal carotid arteries are patent within the neck without stenosis 2. The vertebral arteries are patent within the neck bilaterally. 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 However, this apparent stenosis may be accentuated by vessel tortuosity and motion  artifact. 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 CT perfusion head: There is significant motion degradation which limits the reliability of the perfusion data. The perfusion software identifies a 15 mL region of critically hypoperfused parenchyma utilizing the Tmax>6 seconds threshold. This hypoperfusion is predominantly located within the left frontoparietal lobes. Reported mismatch volume 15 mL. Electronically Signed   By: Kellie Simmering DO   On: 06/19/2020 14:39   CT Code Stroke Cerebral Perfusion with contrast  Result Date: 06/19/2020 CLINICAL DATA:  Neuro deficit, acute, stroke suspected. Additional history provided: Aphasia. EXAM: CT ANGIOGRAPHY HEAD AND NECK CT PERFUSION BRAIN TECHNIQUE: Multidetector CT imaging of the head and neck was performed using the standard protocol during bolus administration of intravenous contrast. Multiplanar CT image reconstructions and MIPs were obtained to evaluate the vascular anatomy. Carotid stenosis measurements (when applicable) are obtained utilizing NASCET criteria, using the distal internal carotid diameter as the denominator. Multiphase CT imaging of the brain was performed following IV bolus contrast injection. Subsequent parametric perfusion maps were calculated using RAPID software. CONTRAST:  Administered contrast not known at this time. COMPARISON:  Noncontrast head CT performed earlier the same day 06/19/2020. FINDINGS: CTA NECK FINDINGS The examination is mildly motion degraded. Aortic arch: Standard aortic branching. Atherosclerotic plaque within the visualized aortic arch and proximal major branch vessels of the neck.  Right carotid system: CCA and ICA patent within the neck without stenosis. Minimal mixed plaque within carotid bifurcation. Left carotid system: See seen ICA patent within the neck without stenosis. Minimal mixed plaque within the carotid bifurcation and proximal ICA. Vertebral arteries: The vertebral arteries are patent within the neck. 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 However, this may be accentuated by vessel tortuosity and motion artifact Skeleton: No acute bony abnormality or aggressive osseous lesion. Cervical spondylosis with multilevel disc space narrowing, posterior disc osteophytes, uncovertebral and facet hypertrophy. Other neck: No neck mass or cervical lymphadenopathy. Multiple thyroid nodules measuring up to 14 mm, not meeting consensus criteria for ultrasound follow-up. Upper chest: No consolidation within the imaged lung apices. No visible pneumothorax. Review of the MIP images confirms the above findings CTA HEAD FINDINGS Anterior circulation: The intracranial internal carotid arteries are patent. A stent is present within the right ICA extending from the pre cavernous to the supraclinoid segment. 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 The M1 middle cerebral arteries are patent without significant stenosis. 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 The anterior cerebral arteries are patent. Posterior circulation: The intracranial vertebral arteries are patent. The basilar artery is patent. The posterior cerebral arteries are patent proximally without significant stenosis. Posterior communicating arteries are hypoplastic or absent bilaterally. Venous sinuses: Within limitations of contrast  timing, no convincing thrombus. Anatomic variants: As described Review of the MIP images confirms the above findings CT Brain Perfusion Findings: There is significant motion degradation, limiting the reliability of the perfusion data. CBF (<30%) Volume: 105mL Perfusion (Tmax>6.0s) volume: 50mL (predominantly within the left frontoparietal lobes) (additional small foci in the region of the right temporal lobe). Mismatch Volume: 41mL Infarction Location:None identified These results were called by telephone at the time of interpretation on 06/19/2020 at 2:39 pm to provider Dr. Rory Percy, who verbally acknowledged these results.  IMPRESSION: CTA neck: 1. The bilateral common and internal carotid arteries are patent within the neck without stenosis 2. The vertebral arteries are patent within the neck bilaterally. 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 However, this apparent stenosis may be accentuated by vessel tortuosity and motion artifact. 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 CT perfusion head: There is significant motion degradation which limits the reliability of the perfusion data. The perfusion software identifies a 15 mL region of critically hypoperfused parenchyma utilizing the Tmax>6 seconds threshold. This hypoperfusion is predominantly located within the left frontoparietal lobes. Reported mismatch volume 15 mL. Electronically Signed   By: Kellie Simmering DO   On: 06/19/2020 14:39   EEG adult  Result Date: 06/20/2020 Lora Havens, MD     06/20/2020  5:23 PM Patient Name: Pamela Hess MRN: [id number] Epilepsy Attending: Lora Havens Referring Physician/Provider: Burnetta Sabin, NP Date: 06/20/2020 Duration: 24.14 mins Patient history: 74yo F presenting with expressive aphasia.  EEG to evaluate for seizure. Level of alertness: Awake, asleep AEDs during EEG study: Gabapentin Technical aspects: This EEG study was done with  scalp electrodes positioned according to the 10-20 International system of electrode placement. Electrical activity was acquired at a sampling rate of 500Hz  and reviewed with a high frequency filter of 70Hz  and a low frequency filter of 1Hz . EEG data were recorded continuously and digitally stored. Description: The posterior dominant rhythm consists of 8-9 Hz activity of moderate voltage (25-35 uV) seen predominantly in posterior head regions, symmetric and reactive to eye opening and eye closing. Sleep was characterized by vertex waves, sleep spindles (12 to 14 Hz), maximal frontocentral region. EEG showed intermittent 3 to 6 Hz theta-delta slowing in left temporal region. Spikes were also seen in left temporal region, maximal T7. Hyperventilation and photic stimulation were not performed.   ABNORMALITY - Intermittent slow, left temporal region - Spike, left temporal region IMPRESSION: This study showed evidence of potential epileptogenicity as well as cortical dysfunction arising from left temporal region likely secondary to underlying stroke. No seizures were seen throughout the recording. Lora Havens   CT HEAD CODE STROKE WO CONTRAST  Result Date: 06/19/2020 CLINICAL DATA:  Code stroke. Neuro deficit, acute, stroke suspected. EXAM: CT HEAD WITHOUT CONTRAST TECHNIQUE: Contiguous axial images were obtained from the base of the skull through the vertex without intravenous contrast. COMPARISON:  Report from brain MRI and MRA head 09/21/2002 (images unavailable). Head CT 09/21/2002. FINDINGS: Brain: Moderate generalized parenchymal atrophy. There are chronic cortically based infarcts within the right parietal lobe and left temporal occipital lobe. Chronic infarcts within the bilateral basal ganglia. Background moderate ill-defined hypoattenuation within the cerebral white matter which is nonspecific, but consistent with chronic small vessel ischemic disease. Small chronic appearing infarcts within the left  cerebellar hemisphere. There is no acute intracranial hemorrhage. No acute demarcated cortical infarct. No extra-axial fluid collection. No evidence of intracranial mass. No midline shift. Vascular: No hyperdense vessel is identified. Right ICA stent. 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 Skull: Normal. Negative for fracture or focal lesion. Sinuses/Orbits: Visualized orbits show no acute finding. No significant paranasal sinus disease or mastoid effusion at the imaged levels ASPECTS [facility] Stroke Program Early CT Score) - Ganglionic level infarction (caudate, lentiform nuclei, internal capsule, insula, M1-M3 cortex): 7 - Supraganglionic infarction (M4-M6 cortex): 3 Total score (0-10 with 10 being normal): 10 (when discounting chronic infarcts) These results were communicated to Dr.  Arora At 2:09 pmon 9/12/2021by text page via the [facility] messaging system. IMPRESSION: No CT evidence of acute intracranial abnormality. ASPECTS is 10 (when discounting chronic infarcts). 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 Chronic lacunar infarcts within the bilateral basal ganglia. Small chronic appearing infarcts within the left cerebellar hemisphere. Background moderate generalized parenchymal atrophy and chronic small vessel ischemic disease. 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 Electronically Signed   By: Kellie Simmering DO   On: 06/19/2020 14:09     Labs:   Basic Metabolic Panel: Recent Labs  Lab 06/19/20 1351 06/19/20 1351 06/19/20 1353 06/19/20 1353 06/20/20 0318 06/21/20 0807  NA 139  --  141  --  141 141  K 3.9   < > 3.8   < > 3.6 3.8  CL 106  --  106  --  107 106  CO2 20*  --   --   --  26 26  GLUCOSE 119*  --  115*  --  103* 107*  BUN 11  --  13  --  9 14  CREATININE 1.45*  --  1.30*  --  1.37* 1.38*  CALCIUM 9.1  --   --   --  8.7* 9.0   < > = values in this interval not displayed.    GFR CrCl cannot be calculated (Unknown ideal weight.). Liver Function Tests: Recent Labs  Lab 06/19/20 1351  AST 16  ALT 12  ALKPHOS 95  BILITOT 0.6  PROT 7.1  ALBUMIN 3.7   No results for input(s): LIPASE, AMYLASE in the last 168 hours. No results for input(s): AMMONIA in the last 168 hours. Coagulation profile Recent Labs  Lab 06/19/20 1351  INR 1.0    CBC: Recent Labs  Lab 06/19/20 1351 06/19/20 1353 06/20/20 0318  WBC 8.5  --  8.6  NEUTROABS 3.8  --  4.1  HGB 13.8 14.6 12.5  HCT 43.0 43.0 39.0  MCV 92.3  --  93.1  PLT 211  --  203   Cardiac Enzymes: No results for input(s): CKTOTAL, CKMB, CKMBINDEX, TROPONINI in the last 168 hours. BNP: Invalid input(s): POCBNP CBG: Recent Labs  Lab 06/20/20 1106 06/20/20 1605 06/20/20 2124 06/21/20 0629 06/21/20 1115  GLUCAP 111* 95 109* 80 129*   D-Dimer No results for input(s): DDIMER in the last 72 hours. Hgb A1c Recent Labs    06/20/20 0318  HGBA1C 6.2*   Lipid Profile Recent Labs    06/20/20 0318  CHOL 112  HDL 33*  LDLCALC 53  TRIG 129  CHOLHDL 3.4   Thyroid function studies No results for input(s): TSH, T4TOTAL, T3FREE, THYROIDAB in the last 72 hours.  Invalid input(s): FREET3 Anemia work up No results for input(s): VITAMINB12, FOLATE, FERRITIN, TIBC, IRON, RETICCTPCT in the last 72 hours. Microbiology Recent Results (from the past 240 hour(s))  SARS Coronavirus 2 by RT PCR (hospital order, performed in [facility] hospital lab) Nasopharyngeal Nasopharyngeal Swab     Status: None   Collection Time: 06/19/20  3:14 PM   Specimen: Nasopharyngeal Swab  Result Value Ref Range Status   SARS Coronavirus 2 NEGATIVE NEGATIVE Final    Comment: (NOTE) SARS-CoV-2 target nucleic acids are NOT DETECTED.  The SARS-CoV-2 RNA is generally detectable in upper and lower respiratory specimens during the acute phase of infection. The lowest concentration of SARS-CoV-2 viral copies this assay can detect  is 250 copies / mL. A negative result  does not preclude SARS-CoV-2 infection and should not be used as the sole basis for treatment or other patient management decisions.  A negative result may occur with improper specimen collection / handling, submission of specimen other than nasopharyngeal swab, presence of viral mutation(s) within the areas targeted by this assay, and inadequate number of viral copies (<250 copies / mL). A negative result must be combined with clinical observations, patient history, and epidemiological information.  Fact Sheet for Patients:   StrictlyIdeas.no  Fact Sheet for Healthcare Providers: BankingDealers.co.za  This test is not yet approved or  cleared by the [place] FDA and has been authorized for detection and/or diagnosis of SARS-CoV-2 by FDA under an Emergency Use Authorization (EUA).  This EUA will remain in effect (meaning this test can be used) for the duration of the COVID-19 declaration under Section 564(b)(1) of the Act, 21 U.S.C. section 360bbb-3(b)(1), unless the authorization is terminated or revoked sooner.  Performed at [facility], [place] [address] [place], [place] [address]      Discharge Instructions:   Discharge Instructions    Ambulatory referral to Neurology   Complete by: As directed    An appointment is requested in approximately: {2-3 weeks   Diet - low sodium heart healthy   Complete by: As directed    Discharge instructions   Complete by: As directed    Stop smoking Per [facility] statutes, patients with seizures are not allowed to drive until they have been seizure-free for six months.   Use caution when using heavy equipment or power tools. Avoid working on ladders or at heights. Take showers instead of baths. Ensure the water temperature is not too high on the home water heater. Do not go swimming alone. Do not lock yourself in a room alone (i.e.  bathroom). When caring for infants or small children, sit down when holding, feeding, or changing them to minimize risk of injury to the child in the event you have a seizure. Maintain good sleep hygiene. Avoid alcohol.   If patienthas another seizure, call 911 and bring them back to the ED if: A. The seizure lasts longer than 5 minutes.  B. The patient doesn't wake shortly after the seizure or has new problems such as difficulty seeing, speaking or moving following the seizure C. The patient was injured during the seizure D. The patient has a temperature over 102 F (39C) E. The patient vomited during the seizure and now is having trouble breathing   Increase activity slowly   Complete by: As directed      Allergies as of 06/21/2020   No Known Allergies     Medication List    STOP taking these medications   metoprolol-hydrochlorothiazide 50-25 MG tablet Commonly known as: LOPRESSOR HCT     TAKE these medications   alendronate 70 MG tablet Commonly known as: FOSAMAX Take 70 mg by mouth once a week.   amLODipine 10 MG tablet Commonly known as: NORVASC Take 10 mg by mouth daily.   aspirin 81 MG EC tablet Take 1 tablet (81 mg total) by mouth daily. Swallow whole.   atorvastatin 20 MG tablet Commonly known as: LIPITOR Take 20 mg by mouth at bedtime.   enalapril 5 MG tablet Commonly known as: VASOTEC Take 5 mg by mouth daily.   Fluticasone-Salmeterol 250-50 MCG/DOSE Aepb Commonly known as: Advair Diskus Inhale 1 puff into the lungs daily as needed (for shortness of breath). What changed: how much to take  folic acid 1 MG tablet Commonly known as: FOLVITE Take 1 mg by mouth daily.   gabapentin 300 MG capsule Commonly known as: NEURONTIN Take 300 mg by mouth 3 (three) times daily.   glimepiride 2 MG tablet Commonly known as: AMARYL Take 2 mg by mouth daily with breakfast.   levETIRAcetam 500 MG tablet Commonly known as: KEPPRA Take 1 tablet (500 mg  total) by mouth 2 (two) times daily.   multivitamin with minerals Tabs tablet Take 1 tablet by mouth daily.   nortriptyline 25 MG capsule Commonly known as: [name] Take 25 mg by mouth at bedtime.   ProAir HFA 108 (90 Base) MCG/ACT inhaler Generic drug: albuterol Inhale 1-2 puffs into the lungs every 6 (six) hours as needed for shortness of breath.   Vitamin D (Ergocalciferol) 1.25 MG (50000 UNIT) Caps capsule Commonly known as: DRISDOL Take 50,000 Units by mouth once a week.       Follow-up Information    Nolene Ebbs, MD Follow up in 1 week(s).   Specialty: Internal Medicine Contact information: [facility] [place] [address] [phone]                Time coordinating discharge: 35 min  Signed:  Geradine Girt DO  [facility] 06/21/2020, 3:34 PM

## 2020-08-24 ENCOUNTER — Inpatient Hospital Stay: Payer: Self-pay | Admitting: Neurology

## 2020-10-18 ENCOUNTER — Inpatient Hospital Stay: Payer: Self-pay | Admitting: Neurology

## 2020-10-18 ENCOUNTER — Encounter: Payer: Self-pay | Admitting: Neurology

## 2023-07-23 ENCOUNTER — Other Ambulatory Visit: Payer: Self-pay | Admitting: Internal Medicine

## 2023-07-23 DIAGNOSIS — N63 Unspecified lump in unspecified breast: Secondary | ICD-10-CM

## 2023-08-06 ENCOUNTER — Ambulatory Visit
Admission: RE | Admit: 2023-08-06 | Discharge: 2023-08-06 | Disposition: A | Discharge status: Home / Self Care | Payer: 59 | Source: Ambulatory Visit | Attending: Internal Medicine | Admitting: Internal Medicine

## 2023-08-06 ENCOUNTER — Ambulatory Visit
Admission: RE | Admit: 2023-08-06 | Discharge: 2023-08-06 | Disposition: A | Discharge status: Home / Self Care | Payer: Medicare Other | Source: Ambulatory Visit | Attending: Internal Medicine | Admitting: Internal Medicine

## 2023-08-06 ENCOUNTER — Other Ambulatory Visit: Payer: Self-pay | Admitting: Internal Medicine

## 2023-08-06 DIAGNOSIS — N63 Unspecified lump in unspecified breast: Secondary | ICD-10-CM

## 2023-08-26 ENCOUNTER — Inpatient Hospital Stay: Admission: RE | Admit: 2023-08-26 | Payer: 59 | Source: Ambulatory Visit
